# Patient Record
Sex: Male | Born: 1955 | Race: White | Hispanic: No | Marital: Married | Smoking: Former smoker
Health system: Southern US, Community
[De-identification: ages and names within clinical notes are randomized; demographics above are authoritative.]

## PROBLEM LIST (undated history)

## (undated) DIAGNOSIS — H9319 Tinnitus, unspecified ear: Secondary | ICD-10-CM

## (undated) DIAGNOSIS — K219 Gastro-esophageal reflux disease without esophagitis: Secondary | ICD-10-CM

## (undated) DIAGNOSIS — N400 Enlarged prostate without lower urinary tract symptoms: Secondary | ICD-10-CM

## (undated) DIAGNOSIS — E785 Hyperlipidemia, unspecified: Secondary | ICD-10-CM

## (undated) DIAGNOSIS — G629 Polyneuropathy, unspecified: Secondary | ICD-10-CM

## (undated) DIAGNOSIS — I10 Essential (primary) hypertension: Secondary | ICD-10-CM

## (undated) DIAGNOSIS — C61 Malignant neoplasm of prostate: Secondary | ICD-10-CM

## (undated) DIAGNOSIS — T8859XA Other complications of anesthesia, initial encounter: Secondary | ICD-10-CM

## (undated) DIAGNOSIS — T4145XA Adverse effect of unspecified anesthetic, initial encounter: Secondary | ICD-10-CM

## (undated) HISTORY — DX: Malignant neoplasm of prostate: C61

## (undated) HISTORY — PX: FACIAL FRACTURE SURGERY: SHX1570

---
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
## 2007-05-29 ENCOUNTER — Ambulatory Visit: Payer: Self-pay | Admitting: Internal Medicine

## 2016-03-22 DIAGNOSIS — K219 Gastro-esophageal reflux disease without esophagitis: Secondary | ICD-10-CM | POA: Insufficient documentation

## 2016-03-22 DIAGNOSIS — E782 Mixed hyperlipidemia: Secondary | ICD-10-CM | POA: Insufficient documentation

## 2019-08-15 ENCOUNTER — Emergency Department (HOSPITAL_COMMUNITY)

## 2019-08-15 ENCOUNTER — Other Ambulatory Visit: Payer: Self-pay

## 2019-08-15 ENCOUNTER — Encounter (HOSPITAL_COMMUNITY): Payer: Self-pay

## 2019-08-15 ENCOUNTER — Emergency Department (HOSPITAL_COMMUNITY)
Admission: EM | Admit: 2019-08-15 | Discharge: 2019-08-15 | Disposition: A | Discharge status: Home / Self Care | Attending: Emergency Medicine | Admitting: Emergency Medicine

## 2019-08-15 DIAGNOSIS — H9313 Tinnitus, bilateral: Secondary | ICD-10-CM | POA: Diagnosis not present

## 2019-08-15 DIAGNOSIS — R0602 Shortness of breath: Secondary | ICD-10-CM | POA: Diagnosis not present

## 2019-08-15 DIAGNOSIS — R42 Dizziness and giddiness: Secondary | ICD-10-CM | POA: Diagnosis present

## 2019-08-15 DIAGNOSIS — Z79899 Other long term (current) drug therapy: Secondary | ICD-10-CM | POA: Insufficient documentation

## 2019-08-15 DIAGNOSIS — I10 Essential (primary) hypertension: Secondary | ICD-10-CM | POA: Insufficient documentation

## 2019-08-15 DIAGNOSIS — R0600 Dyspnea, unspecified: Secondary | ICD-10-CM

## 2019-08-15 LAB — CBC
HCT: 44.2 % (ref 39.0–52.0)
Hemoglobin: 14.4 g/dL (ref 13.0–17.0)
MCH: 28.5 pg (ref 26.0–34.0)
MCHC: 32.6 g/dL (ref 30.0–36.0)
MCV: 87.4 fL (ref 80.0–100.0)
Platelets: 170 10*3/uL (ref 150–400)
RBC: 5.06 MIL/uL (ref 4.22–5.81)
RDW: 13.2 % (ref 11.5–15.5)
WBC: 8.6 10*3/uL (ref 4.0–10.5)
nRBC: 0 % (ref 0.0–0.2)

## 2019-08-15 LAB — BASIC METABOLIC PANEL
Anion gap: 11 (ref 5–15)
BUN: 25 mg/dL — ABNORMAL HIGH (ref 8–23)
CO2: 21 mmol/L — ABNORMAL LOW (ref 22–32)
Calcium: 9.6 mg/dL (ref 8.9–10.3)
Chloride: 106 mmol/L (ref 98–111)
Creatinine, Ser: 1.55 mg/dL — ABNORMAL HIGH (ref 0.61–1.24)
GFR calc Af Amer: 55 mL/min — ABNORMAL LOW (ref >60)
GFR calc non Af Amer: 47 mL/min — ABNORMAL LOW (ref >60)
Glucose, Bld: 111 mg/dL — ABNORMAL HIGH (ref 70–99)
Potassium: 4.2 mmol/L (ref 3.5–5.1)
Sodium: 138 mmol/L (ref 135–145)

## 2019-08-15 LAB — URINALYSIS, ROUTINE W REFLEX MICROSCOPIC
Bilirubin Urine: NEGATIVE
Glucose, UA: NEGATIVE mg/dL
Hgb urine dipstick: NEGATIVE
Ketones, ur: NEGATIVE mg/dL
Leukocytes,Ua: NEGATIVE
Nitrite: NEGATIVE
Protein, ur: NEGATIVE mg/dL
Specific Gravity, Urine: 1.02 (ref 1.005–1.030)
pH: 5 (ref 5.0–8.0)

## 2019-08-15 LAB — HEPATIC FUNCTION PANEL
ALT: 22 U/L (ref 0–44)
AST: 24 U/L (ref 15–41)
Albumin: 4.1 g/dL (ref 3.5–5.0)
Alkaline Phosphatase: 60 U/L (ref 38–126)
Bilirubin, Direct: 0.1 mg/dL (ref 0.0–0.2)
Indirect Bilirubin: 1 mg/dL — ABNORMAL HIGH (ref 0.3–0.9)
Total Bilirubin: 1.1 mg/dL (ref 0.3–1.2)
Total Protein: 7.1 g/dL (ref 6.5–8.1)

## 2019-08-15 LAB — BRAIN NATRIURETIC PEPTIDE: B Natriuretic Peptide: 13.3 pg/mL (ref 0.0–100.0)

## 2019-08-15 LAB — D-DIMER, QUANTITATIVE: D-Dimer, Quant: 0.37 ug/mL-FEU (ref 0.00–0.50)

## 2019-08-15 NOTE — ED Provider Notes (Addendum)
[facility] Provider Note   CSN: [id number] Arrival date & time: 08/15/19  1250     History Chief Complaint  Patient presents with  . Dizziness  . Shortness of Breath    Kelley Wolken is a 64 y.o. male.  64 year old male with past medical history including hypertension, hyperlipidemia who presents with multiple complaints including tinnitus, dizziness, and shortness of breath.  Patient reports that he has had 2 days of bilateral intermittent tinnitus without any associated ear pain.  He reports some mild nasal congestion but no other URI symptoms.  No fevers.  He reports that he has had dizziness for the past 2 days which she states is a lightheadedness feeling that occurs with standing.  No syncope.  No associated chest pain.  He reports a 1 month history of dyspnea on exertion.  No associated leg swelling.  He is compliant with his medications.  Of note, he states that he was told he has an elevated PSA level but has not seen a urologist yet for prostate biopsy.  The history is provided by the patient.  Dizziness Associated symptoms: shortness of breath   Shortness of Breath      History reviewed. No pertinent past medical history.  There are no problems to display for this patient.   History reviewed. No pertinent surgical history.    PMH: HTN HLD Enlarged prostate  No family history on file.  Social History   Tobacco Use  . Smoking status: Not on file  Substance Use Topics  . Alcohol use: Not on file  . Drug use: Not on file  denies drug or alcohol use  Home Medications Prior to Admission medications   Not on File  HCTZ Lisinopril Flomax Duloxetine  Allergies    Patient has no allergy information on record.  Review of Systems   Review of Systems  Respiratory: Positive for shortness of breath.   Neurological: Positive for dizziness.   All other systems reviewed and are negative except that which was mentioned in  HPI  Physical Exam Updated Vital Signs BP 122/82 (BP Location: Right Arm)   Pulse 80   Temp 99.1 F (37.3 C) (Oral)   Resp 18   SpO2 99%   Physical Exam Vitals and nursing note reviewed.  Constitutional:      General: He is not in acute distress.    Appearance: He is well-developed.     Comments: Awake, alert  HENT:     Head: Normocephalic and atraumatic.     Right Ear: Tympanic membrane and ear canal normal.     Left Ear: Tympanic membrane and ear canal normal.     Mouth/Throat:     Mouth: Mucous membranes are moist.     Pharynx: Oropharynx is clear.  Eyes:     Extraocular Movements: Extraocular movements intact.     Conjunctiva/sclera: Conjunctivae normal.     Pupils: Pupils are equal, round, and reactive to light.  Cardiovascular:     Rate and Rhythm: Normal rate and regular rhythm.     Heart sounds: Normal heart sounds. No murmur.  Pulmonary:     Effort: Pulmonary effort is normal. No respiratory distress.     Breath sounds: Normal breath sounds.  Abdominal:     General: Bowel sounds are normal. There is no distension.     Palpations: Abdomen is soft.     Tenderness: There is no abdominal tenderness.  Musculoskeletal:     Cervical back: Neck supple.  Right lower leg: No edema.     Left lower leg: No edema.  Skin:    General: Skin is warm and dry.  Neurological:     Mental Status: He is alert and oriented to person, place, and time.     Cranial Nerves: No cranial nerve deficit.     Motor: No abnormal muscle tone.     Deep Tendon Reflexes: Reflexes are normal and symmetric.     Comments: Fluent speech  5/5 strength and normal sensation x all 4 extremities  Psychiatric:        Thought Content: Thought content normal.        Judgment: Judgment normal.     ED Results / Procedures / Treatments   Labs (all labs ordered are listed, but only abnormal results are displayed) Labs Reviewed  BASIC METABOLIC PANEL - Abnormal; Notable for the following components:        Result Value   CO2 21 (*)    Glucose, Bld 111 (*)    BUN 25 (*)    Creatinine, Ser 1.55 (*)    GFR calc non Af Amer 47 (*)    GFR calc Af Amer 55 (*)    All other components within normal limits  HEPATIC FUNCTION PANEL - Abnormal; Notable for the following components:   Indirect Bilirubin 1.0 (*)    All other components within normal limits  CBC  D-DIMER, QUANTITATIVE (NOT AT [facility])  BRAIN NATRIURETIC PEPTIDE  URINALYSIS, ROUTINE W REFLEX MICROSCOPIC    EKG EKG Interpretation  Date/Time:  Thursday August 15 2019 17:24:51 EST Ventricular Rate:  72 PR Interval:  170 QRS Duration: 86 QT Interval:  396 QTC Calculation: 433 R Axis:   20 Text Interpretation: Normal sinus rhythm Septal infarct , age undetermined Abnormal ECG No previous ECGs available Confirmed by Theotis Burrow [phone]) on 08/15/2019 6:25:48 PM   Radiology DG Chest 2 View  Result Date: 08/15/2019 CLINICAL DATA:  Shortness of breath EXAM: CHEST - 2 VIEW COMPARISON:  None. FINDINGS: The heart size and mediastinal contours are within normal limits. Both lungs are clear. Disc degenerative disease of the thoracic spine. IMPRESSION: No acute abnormality of the lungs. Electronically Signed   By: Eddie Candle M.D.   On: 08/15/2019 14:01   CT Head Wo Contrast  Result Date: 08/15/2019 CLINICAL DATA:  Tinnitus ringing in the ear EXAM: CT HEAD WITHOUT CONTRAST TECHNIQUE: Contiguous axial images were obtained from the base of the skull through the vertex without intravenous contrast. COMPARISON:  None. FINDINGS: Brain: No evidence of acute territorial infarction, hemorrhage, hydrocephalus,extra-axial collection or mass lesion/mass effect. Normal gray-white differentiation. Ventricles are normal in size and contour. Vascular: No hyperdense vessel or unexpected calcification. Skull: The skull is intact. No fracture or focal lesion identified. There is been a prior non healed fracture of the right nasal bone with rightward  deviation. Sinuses/Orbits: The visualized paranasal sinuses and mastoid air cells are clear. The orbits and globes intact. Other: None IMPRESSION: No acute intracranial abnormality. Electronically Signed   By: Prudencio Pair M.D.   On: 08/15/2019 18:55    Procedures Procedures (including critical care time)  Medications Ordered in ED Medications - No data to display  ED Course  I have reviewed the triage vital signs and the nursing notes.  Pertinent labs & imaging results that were available during my care of the patient were reviewed by me and considered in my medical decision making (see chart for details).  Orthostatic VS for the  past 24 hrs:  BP- Lying Pulse- Lying BP- Sitting Pulse- Sitting BP- Standing at 0 minutes Pulse- Standing at 0 minutes  08/15/19 1604 106/69 74 105/81 88 107/74 89       MDM Rules/Calculators/A&P                      Well appearing on exam, no neuro deficits, no ear findings. Orthostatics negative.  1) Tinnitus: no abnormal findings on physical exam. No vertigo symptoms. Takes aspirin once daily, no heavy ASA use. Head CT negative. Discussed f/u with ENT for further eval.  2) SOB: D-dimer negative making PE unlikely. No wheezing on exam, no evidence of volume overload, CXR clear. No chest pain or other symptoms suggestive of ACS. BNP normal and EKG unremarkable. Recommended close PCP f/u.  3) dizziness: no description of vertigo, described as lighthteadedness. Orthostatics negative. Reassuring CMP, CBC without evidence of infection or dehydration. No obvious culprit in medication list. Recommended PCP f/u for further eval.  4) Prostate problems: UA normal here. No acute sx, just states he has had rising PSA. PRovided w/ [facility] f/u info.  I have extensively reviewed return precautions including any chest pain, strokelike symptoms, or sudden worsening of any of his symptoms.  He voiced understanding. Final Clinical Impression(s) / ED  Diagnoses Final diagnoses:  Tinnitus of both ears  Dizziness  Dyspnea, unspecified type    Rx / DC Orders ED Discharge Orders    None       Tiyonna Sardinha, Wenda Overland, MD 08/15/19 1941    Rex Kras, Wenda Overland, MD 08/15/19 1956

## 2019-08-15 NOTE — ED Notes (Signed)
Pt transported to CT ?

## 2019-08-15 NOTE — ED Triage Notes (Signed)
Pt bib ems for dizziness for 2 days and ringing in his ears for 4 days with SOB on exertion. Pt denies chest pain n/v. VSS Pt a.o   18G RAC

## 2019-09-16 ENCOUNTER — Inpatient Hospital Stay (HOSPITAL_COMMUNITY)
Admission: EM | Admit: 2019-09-16 | Discharge: 2019-09-20 | DRG: 683 | Disposition: A | Discharge status: Home / Self Care | Attending: Internal Medicine | Admitting: Internal Medicine

## 2019-09-16 ENCOUNTER — Other Ambulatory Visit: Payer: Self-pay

## 2019-09-16 ENCOUNTER — Encounter (HOSPITAL_COMMUNITY): Payer: Self-pay | Admitting: Emergency Medicine

## 2019-09-16 DIAGNOSIS — F329 Major depressive disorder, single episode, unspecified: Secondary | ICD-10-CM | POA: Diagnosis present

## 2019-09-16 DIAGNOSIS — E785 Hyperlipidemia, unspecified: Secondary | ICD-10-CM | POA: Diagnosis present

## 2019-09-16 DIAGNOSIS — N179 Acute kidney failure, unspecified: Secondary | ICD-10-CM | POA: Diagnosis not present

## 2019-09-16 DIAGNOSIS — I5032 Chronic diastolic (congestive) heart failure: Secondary | ICD-10-CM | POA: Diagnosis present

## 2019-09-16 DIAGNOSIS — R0602 Shortness of breath: Secondary | ICD-10-CM

## 2019-09-16 DIAGNOSIS — H9313 Tinnitus, bilateral: Secondary | ICD-10-CM

## 2019-09-16 DIAGNOSIS — I10 Essential (primary) hypertension: Secondary | ICD-10-CM | POA: Diagnosis present

## 2019-09-16 DIAGNOSIS — J0100 Acute maxillary sinusitis, unspecified: Secondary | ICD-10-CM

## 2019-09-16 DIAGNOSIS — Z20822 Contact with and (suspected) exposure to covid-19: Secondary | ICD-10-CM | POA: Diagnosis present

## 2019-09-16 DIAGNOSIS — Z820 Family history of epilepsy and other diseases of the nervous system: Secondary | ICD-10-CM

## 2019-09-16 DIAGNOSIS — N400 Enlarged prostate without lower urinary tract symptoms: Secondary | ICD-10-CM | POA: Diagnosis present

## 2019-09-16 DIAGNOSIS — R0600 Dyspnea, unspecified: Secondary | ICD-10-CM

## 2019-09-16 DIAGNOSIS — G629 Polyneuropathy, unspecified: Secondary | ICD-10-CM

## 2019-09-16 DIAGNOSIS — R972 Elevated prostate specific antigen [PSA]: Secondary | ICD-10-CM | POA: Diagnosis present

## 2019-09-16 DIAGNOSIS — Z87891 Personal history of nicotine dependence: Secondary | ICD-10-CM

## 2019-09-16 DIAGNOSIS — N1831 Chronic kidney disease, stage 3a: Secondary | ICD-10-CM | POA: Diagnosis present

## 2019-09-16 DIAGNOSIS — Z8249 Family history of ischemic heart disease and other diseases of the circulatory system: Secondary | ICD-10-CM

## 2019-09-16 DIAGNOSIS — H8109 Meniere's disease, unspecified ear: Secondary | ICD-10-CM | POA: Diagnosis present

## 2019-09-16 DIAGNOSIS — Z23 Encounter for immunization: Secondary | ICD-10-CM

## 2019-09-16 DIAGNOSIS — I130 Hypertensive heart and chronic kidney disease with heart failure and stage 1 through stage 4 chronic kidney disease, or unspecified chronic kidney disease: Secondary | ICD-10-CM | POA: Diagnosis present

## 2019-09-16 DIAGNOSIS — R42 Dizziness and giddiness: Secondary | ICD-10-CM

## 2019-09-16 HISTORY — DX: Hyperlipidemia, unspecified: E78.5

## 2019-09-16 HISTORY — DX: Polyneuropathy, unspecified: G62.9

## 2019-09-16 HISTORY — DX: Benign prostatic hyperplasia without lower urinary tract symptoms: N40.0

## 2019-09-16 HISTORY — DX: Tinnitus, unspecified ear: H93.19

## 2019-09-16 HISTORY — DX: Gastro-esophageal reflux disease without esophagitis: K21.9

## 2019-09-16 HISTORY — DX: Other complications of anesthesia, initial encounter: T88.59XA

## 2019-09-16 HISTORY — DX: Essential (primary) hypertension: I10

## 2019-09-16 LAB — CBC WITH DIFFERENTIAL/PLATELET
Abs Immature Granulocytes: 0.02 10*3/uL (ref 0.00–0.07)
Basophils Absolute: 0 10*3/uL (ref 0.0–0.1)
Basophils Relative: 1 %
Eosinophils Absolute: 0.3 10*3/uL (ref 0.0–0.5)
Eosinophils Relative: 4 %
HCT: 43.2 % (ref 39.0–52.0)
Hemoglobin: 14.5 g/dL (ref 13.0–17.0)
Immature Granulocytes: 0 %
Lymphocytes Relative: 18 %
Lymphs Abs: 1.3 10*3/uL (ref 0.7–4.0)
MCH: 28.7 pg (ref 26.0–34.0)
MCHC: 33.6 g/dL (ref 30.0–36.0)
MCV: 85.4 fL (ref 80.0–100.0)
Monocytes Absolute: 0.6 10*3/uL (ref 0.1–1.0)
Monocytes Relative: 8 %
Neutro Abs: 5 10*3/uL (ref 1.7–7.7)
Neutrophils Relative %: 69 %
Platelets: 163 10*3/uL (ref 150–400)
RBC: 5.06 MIL/uL (ref 4.22–5.81)
RDW: 13.1 % (ref 11.5–15.5)
WBC: 7.1 10*3/uL (ref 4.0–10.5)
nRBC: 0 % (ref 0.0–0.2)

## 2019-09-16 LAB — COMPREHENSIVE METABOLIC PANEL
ALT: 25 U/L (ref 0–44)
AST: 21 U/L (ref 15–41)
Albumin: 3.8 g/dL (ref 3.5–5.0)
Alkaline Phosphatase: 59 U/L (ref 38–126)
Anion gap: 10 (ref 5–15)
BUN: 31 mg/dL — ABNORMAL HIGH (ref 8–23)
CO2: 20 mmol/L — ABNORMAL LOW (ref 22–32)
Calcium: 9.3 mg/dL (ref 8.9–10.3)
Chloride: 109 mmol/L (ref 98–111)
Creatinine, Ser: 1.95 mg/dL — ABNORMAL HIGH (ref 0.61–1.24)
GFR calc Af Amer: 42 mL/min — ABNORMAL LOW (ref >60)
GFR calc non Af Amer: 36 mL/min — ABNORMAL LOW (ref >60)
Glucose, Bld: 195 mg/dL — ABNORMAL HIGH (ref 70–99)
Potassium: 4.5 mmol/L (ref 3.5–5.1)
Sodium: 139 mmol/L (ref 135–145)
Total Bilirubin: 0.4 mg/dL (ref 0.3–1.2)
Total Protein: 6.6 g/dL (ref 6.5–8.1)

## 2019-09-16 LAB — POC SARS CORONAVIRUS 2 AG -  ED: SARS Coronavirus 2 Ag: NEGATIVE

## 2019-09-16 NOTE — ED Triage Notes (Signed)
Per EMS, pt from [place], C/O SOB x1 month however "really bad today".  Was seen earlier in week but was not able to get get steroids or antibiotic.  Lungs are clear, no fever, no pain.  RR 26,   18 G L AC 133/86 106HR

## 2019-09-17 ENCOUNTER — Emergency Department (HOSPITAL_COMMUNITY)

## 2019-09-17 ENCOUNTER — Encounter (HOSPITAL_COMMUNITY): Payer: Self-pay | Admitting: Internal Medicine

## 2019-09-17 ENCOUNTER — Inpatient Hospital Stay (HOSPITAL_COMMUNITY)

## 2019-09-17 DIAGNOSIS — R0602 Shortness of breath: Secondary | ICD-10-CM

## 2019-09-17 DIAGNOSIS — N179 Acute kidney failure, unspecified: Secondary | ICD-10-CM | POA: Diagnosis present

## 2019-09-17 DIAGNOSIS — E785 Hyperlipidemia, unspecified: Secondary | ICD-10-CM | POA: Diagnosis present

## 2019-09-17 DIAGNOSIS — N400 Enlarged prostate without lower urinary tract symptoms: Secondary | ICD-10-CM | POA: Diagnosis present

## 2019-09-17 DIAGNOSIS — I10 Essential (primary) hypertension: Secondary | ICD-10-CM | POA: Diagnosis present

## 2019-09-17 DIAGNOSIS — G629 Polyneuropathy, unspecified: Secondary | ICD-10-CM

## 2019-09-17 DIAGNOSIS — I5032 Chronic diastolic (congestive) heart failure: Secondary | ICD-10-CM | POA: Diagnosis present

## 2019-09-17 LAB — LIPID PANEL
Cholesterol: 150 mg/dL (ref 0–200)
HDL: 29 mg/dL — ABNORMAL LOW (ref >40)
LDL Cholesterol: 86 mg/dL (ref 0–99)
Total CHOL/HDL Ratio: 5.2 RATIO
Triglycerides: 176 mg/dL — ABNORMAL HIGH (ref <150)
VLDL: 35 mg/dL (ref 0–40)

## 2019-09-17 LAB — HIV ANTIBODY (ROUTINE TESTING W REFLEX): HIV Screen 4th Generation wRfx: NONREACTIVE

## 2019-09-17 LAB — TROPONIN I (HIGH SENSITIVITY): Troponin I (High Sensitivity): 3 ng/L (ref <18)

## 2019-09-17 LAB — SARS CORONAVIRUS 2 (TAT 6-24 HRS): SARS Coronavirus 2: NEGATIVE

## 2019-09-17 LAB — HEMOGLOBIN A1C
Hgb A1c MFr Bld: 6.1 % — ABNORMAL HIGH (ref 4.8–5.6)
Mean Plasma Glucose: 128.37 mg/dL

## 2019-09-17 LAB — TSH: TSH: 2.456 u[IU]/mL (ref 0.350–4.500)

## 2019-09-17 LAB — ECHOCARDIOGRAM COMPLETE

## 2019-09-17 MED ORDER — ONDANSETRON HCL 4 MG/2ML IJ SOLN: 4.0000 mg | Freq: Four times a day (QID) | INTRAMUSCULAR | Status: DC | PRN

## 2019-09-17 MED ORDER — ONDANSETRON HCL 4 MG PO TABS: 4.0000 mg | ORAL_TABLET | Freq: Four times a day (QID) | ORAL | Status: DC | PRN

## 2019-09-17 MED ORDER — MECLIZINE HCL 25 MG PO TABS
25.0000 mg | ORAL_TABLET | Freq: Once | ORAL | Status: AC
  Administered 2019-09-17: 25 mg via ORAL
  Filled 2019-09-17: qty 1

## 2019-09-17 MED ORDER — ACETAMINOPHEN 325 MG PO TABS: 650.0000 mg | ORAL_TABLET | Freq: Four times a day (QID) | ORAL | Status: DC | PRN

## 2019-09-17 MED ORDER — ACETAMINOPHEN 500 MG PO TABS
1000.0000 mg | ORAL_TABLET | Freq: Once | ORAL | Status: AC
  Administered 2019-09-17: 1000 mg via ORAL
  Filled 2019-09-17: qty 2

## 2019-09-17 MED ORDER — DOCUSATE SODIUM 100 MG PO CAPS
100.0000 mg | ORAL_CAPSULE | Freq: Two times a day (BID) | ORAL | Status: DC
  Administered 2019-09-19: 100 mg via ORAL
  Filled 2019-09-17 (×7): qty 1

## 2019-09-17 MED ORDER — SODIUM CHLORIDE 0.9 % IV BOLUS (SEPSIS)
1000.0000 mL | Freq: Once | INTRAVENOUS | Status: AC
  Administered 2019-09-17: 1000 mL via INTRAVENOUS

## 2019-09-17 MED ORDER — TAMSULOSIN HCL 0.4 MG PO CAPS
0.4000 mg | ORAL_CAPSULE | Freq: Every day | ORAL | Status: DC
  Administered 2019-09-17 – 2019-09-20 (×4): 0.4 mg via ORAL
  Filled 2019-09-17 (×4): qty 1

## 2019-09-17 MED ORDER — SODIUM CHLORIDE 0.9% FLUSH
3.0000 mL | Freq: Two times a day (BID) | INTRAVENOUS | Status: DC
  Administered 2019-09-19 – 2019-09-20 (×2): 3 mL via INTRAVENOUS

## 2019-09-17 MED ORDER — DULOXETINE HCL 20 MG PO CPEP
40.0000 mg | ORAL_CAPSULE | Freq: Once | ORAL | Status: AC
  Administered 2019-09-17: 40 mg via ORAL
  Filled 2019-09-17: qty 2

## 2019-09-17 MED ORDER — SODIUM CHLORIDE 0.9 % IV SOLN: INTRAVENOUS | Status: DC

## 2019-09-17 MED ORDER — ATORVASTATIN CALCIUM 10 MG PO TABS
10.0000 mg | ORAL_TABLET | Freq: Every day | ORAL | Status: DC
  Administered 2019-09-17 – 2019-09-20 (×4): 10 mg via ORAL
  Filled 2019-09-17 (×4): qty 1

## 2019-09-17 MED ORDER — ACETAMINOPHEN 650 MG RE SUPP: 650.0000 mg | Freq: Four times a day (QID) | RECTAL | Status: DC | PRN

## 2019-09-17 MED ORDER — DULOXETINE HCL 30 MG PO CPEP
30.0000 mg | ORAL_CAPSULE | Freq: Two times a day (BID) | ORAL | Status: DC
  Administered 2019-09-17 – 2019-09-20 (×7): 30 mg via ORAL
  Filled 2019-09-17 (×8): qty 1

## 2019-09-17 MED ORDER — ENOXAPARIN SODIUM 40 MG/0.4ML ~~LOC~~ SOLN
40.0000 mg | SUBCUTANEOUS | Status: DC
  Administered 2019-09-18 – 2019-09-19 (×2): 40 mg via SUBCUTANEOUS
  Filled 2019-09-17 (×2): qty 0.4

## 2019-09-17 MED ORDER — ASPIRIN EC 81 MG PO TBEC
81.0000 mg | DELAYED_RELEASE_TABLET | Freq: Every day | ORAL | Status: DC
  Administered 2019-09-17 – 2019-09-20 (×4): 81 mg via ORAL
  Filled 2019-09-17 (×4): qty 1

## 2019-09-17 NOTE — ED Provider Notes (Signed)
TIME SEEN: 2:49 AM  CHIEF COMPLAINT: Multiple complaints  HPI: Patient is a 64 year old male who presents to the emergency department multiple complaints.  He reports for the past month he has had dizziness that is like a lightheadedness and the room spinning.  He has had tinnitus without hearing loss.  He reports bilateral ear pain and frequent nosebleeds.  He is also felt short of breath but no chest pain or chest discomfort.  Shortness of breath worse with exertion.  No vomiting or diarrhea.  No fever.  Recently got out of prison and is living in a halfway house.  No known history of chronic kidney disease.  States he does have an elevated PSA but has not had a biopsy of his prostate.  Experiencing restless leg syndrome currently requesting a dose of his Cymbalta.  Was seen by an outpatient doctor and prescribed Augmentin and prednisone to help with his symptoms of sinusitis but has not been able to get them filled because they have to be approved by an outside entity in [place].  ROS: See HPI Constitutional: no fever  Eyes: no drainage  ENT: no runny nose   Cardiovascular:  no chest pain  Resp:  SOB  GI: no vomiting GU: no dysuria Integumentary: no rash  Allergy: no hives  Musculoskeletal: no leg swelling  Neurological: no slurred speech ROS otherwise negative  PAST MEDICAL HISTORY/PAST SURGICAL HISTORY:  History reviewed. No pertinent past medical history.  MEDICATIONS:  Prior to Admission medications   Not on File    ALLERGIES:  No Known Allergies  SOCIAL HISTORY:  Social History   Tobacco Use  . Smoking status: Not on file  Substance Use Topics  . Alcohol use: Not on file    FAMILY HISTORY: No family history on file.  EXAM: BP 119/83 (BP Location: Right Arm)   Pulse 77   Temp 97.7 F (36.5 C) (Oral)   Resp 20   SpO2 100%  CONSTITUTIONAL: Alert and oriented and responds appropriately to questions. Well-appearing; well-nourished HEAD:  Normocephalic EYES: Conjunctivae clear, pupils appear equal, EOM appear intact ENT: normal nose; moist mucous membranes; TMs are clear bilaterally without erythema, purulence, bulging, perforation, effusion.  No cerumen impaction or sign of foreign body in the external auditory canal. No inflammation, erythema or drainage from the external auditory canal. No signs of mastoiditis. No pain with manipulation of the pinna bilaterally.  No active epistaxis.  Pain over the maxillary sinuses without facial swelling. NECK: Supple, normal ROM CARD: RRR; S1 and S2 appreciated; no murmurs, no clicks, no rubs, no gallops RESP: Normal chest excursion without splinting or tachypnea; breath sounds clear and equal bilaterally; no wheezes, no rhonchi, no rales, no hypoxia or respiratory distress, speaking full sentences ABD/GI: Normal bowel sounds; non-distended; soft, non-tender, no rebound, no guarding, no peritoneal signs, no hepatosplenomegaly BACK:  The back appears normal EXT: Normal ROM in all joints; no deformity noted, no edema; no cyanosis SKIN: Normal color for age and race; warm; no rash on exposed skin NEURO: Moves all extremities equally, restless leg on exam, normal speech, no facial asymmetry PSYCH: The patient's mood and manner are appropriate.   MEDICAL DECISION MAKING: Patient here with complaints mostly of shortness of breath.  He is concerned he could have Covid but symptoms been ongoing for over a month.  He would be outside of infectious window.  His chest x-ray is clear and shows no pneumonia, edema or pneumothorax.  Satting 97% on room air at rest  without signs of respiratory distress or increased work of breathing.  Lungs are currently clear.  He is concerned this could be a "blockage".  We will add on troponin.  His EKG shows no ischemic change.  He did have a negative D-dimer on February 4.  As for his tinnitus, nosebleeds, sinus pain for over a month, I agree with outpatient Augmentin and  prednisone.  He states he is having a hard time getting these medications filled.    He is requesting also a dose of his Cymbalta for his chronic neuropathy and restless leg.  His labs today do show acute kidney injury.  Kidney function was elevated to 1.55 a month ago and is now 1.95.  He denies any history of chronic kidney disease.  Given this is worsening over the past month, will give IV fluids and admit for observation.  ED PROGRESS: 4:18 AM Discussed patient's case with hospitalist, Dr. Marlowe Sax.  I have recommended admission and patient (and family if present) agree with this plan. Admitting physician will place admission orders.   I reviewed all nursing notes, vitals, pertinent previous records and interpreted all EKGs, lab and urine results, imaging (as available).     EKG Interpretation  Date/Time:  Monday September 16 2019 20:06:57 EST Ventricular Rate:  107 PR Interval:  172 QRS Duration: 80 QT Interval:  304 QTC Calculation: 405 R Axis:   11 Text Interpretation: Sinus tachycardia Anteroseptal infarct , age undetermined Abnormal ECG When compared with ECG of 08/15/2019, HEART RATE has increased Confirmed by Delora Fuel ([id number]) on 09/17/2019 12:27:32 AM         Adrian Herrera was evaluated in Emergency Department on 09/17/2019 for the symptoms described in the history of present illness. He was evaluated in the context of the global COVID-19 pandemic, which necessitated consideration that the patient might be at risk for infection with the SARS-CoV-2 virus that causes COVID-19. Institutional protocols and algorithms that pertain to the evaluation of patients at risk for COVID-19 are in a state of rapid change based on information released by regulatory bodies including the CDC and federal and state organizations. These policies and algorithms were followed during the patient's care in the ED.  Patient was seen wearing N95, face shield, gloves.    Adrian Herrera, Delice Bison, DO 09/17/19 [phone]

## 2019-09-17 NOTE — Plan of Care (Signed)
  Problem: Education: Goal: Knowledge of General Education information will improve Description Including pain rating scale, medication(s)/side effects and non-pharmacologic comfort measures Outcome: Progressing   

## 2019-09-17 NOTE — ED Notes (Signed)
Pt transported to ultrasound.

## 2019-09-17 NOTE — Progress Notes (Signed)
  Echocardiogram 2D Echocardiogram has been performed.  Adrian Herrera 09/17/2019, 2:44 PM

## 2019-09-17 NOTE — ED Notes (Signed)
covid swab collected, labeled with 2 pt identifiers, and brought to lab 

## 2019-09-17 NOTE — ED Notes (Signed)
Admitting message to inquire about pt diet order.

## 2019-09-17 NOTE — ED Notes (Signed)
All meds given per MAR. Name/DOB verified with pt 

## 2019-09-17 NOTE — ED Notes (Signed)
Lunch Tray Ordered @ 1038.  

## 2019-09-17 NOTE — H&P (Signed)
History and Physical    Adrian Herrera ZD:[id number] DOB: July 26, 1955 DOA: 09/16/2019  PCP: System, [place] Not In - Ginny Forth, [place] Consultants:  Gerlene Burdock - urology, [place] Patient coming from:  Home - lives alone; NOK: Earna Coder, girlfriend, [phone]  Chief Complaint: Dizziness  HPI: Adrian Herrera is a 64 y.o. male with medical history significant of HTN and HLD presenting with dizziness.  He was seen on 08/15/19 by Dr. Rex Kras in the ER with dizziness and SOB and was referred to ENT and urology.   He reports that he is easily out of breath, difficulty walking far.  He developed tinnitus about 1/28.  Dizzy with standing up or bending over.  He came to the ER and was diagnosed with tinnitus.  +orthopnea.  No PND but wakes up at night with phlegm in his throat.  He also has bloody drainage when he blows his nose.  Mild LE edema.  He feels like he can't catch his breath because of nasal congestion.  No fever.  He also has a throbbing pain intermittently in his left toe, has h/o neuropathy.  No difficulty with eating and drinking.  His PSA was 9, 10, 10.1, hard to pee then, given Flomax with improvement.    ED Course:  Carryover, per Dr. Marlowe Sax:  Patient presented with multiple complaints including shortness of breath and dizziness. He was seen a month ago for similar complaints. Creatinine was 1.55 at that time and now up to 1.95. No documented history of CKD. No complains of vomiting or diarrhea. Not on any nephrotoxic medications. Admission requested for IV fluid hydration and further monitoring of renal function. He does not have good outpatient follow-up, recently came out of prison. As far as shortness of breath is concerned, chest x-ray negative and was ambulated in the ED and maintained oxygen saturation above 96%. He had negative D-dimer during previous ED visit a few days ago so it was not repeated. Rapid Covid test negative. Troponin pending.  Review of Systems: As per HPI; otherwise  review of systems reviewed and negative.   Ambulatory Status:  Ambulates without assistance  Past Medical History:  Diagnosis Date  . BPH with elevated PSA   . Dyslipidemia   . Hypertension   . Neuropathy     Past Surgical History:  Procedure Laterality Date  . FACIAL FRACTURE SURGERY     "I run into a crow bar about 4 times"    Social History   Socioeconomic History  . Marital status: Divorced    Spouse name: Not on file  . Number of children: Not on file  . Years of education: Not on file  . Highest education level: Not on file  Occupational History  . Occupation: retiring from Teacher, adult education care  Tobacco Use  . Smoking status: Former Smoker    Packs/day: 2.00    Years: 15.00    Pack years: 30.00  . Smokeless tobacco: Never Used  Substance and Sexual Activity  . Alcohol use: Not Currently  . Drug use: Not Currently    Types: Marijuana  . Sexual activity: Not on file  Other Topics Concern  . Not on file  Social History Narrative  . Not on file   Social Determinants of Health   Financial Resource Strain:   . Difficulty of Paying Living Expenses: Not on file  Food Insecurity:   . Worried About Charity fundraiser in the Last Year: Not on file  . Ran Out of Food in the Last  Year: Not on file  Transportation Needs:   . Lack of Transportation (Medical): Not on file  . Lack of Transportation (Non-Medical): Not on file  Physical Activity:   . Days of Exercise per Week: Not on file  . Minutes of Exercise per Session: Not on file  Stress:   . Feeling of Stress : Not on file  Social Connections:   . Frequency of Communication with Friends and Family: Not on file  . Frequency of Social Gatherings with Friends and Family: Not on file  . Attends Religious Services: Not on file  . Active Member of Clubs or Organizations: Not on file  . Attends Archivist Meetings: Not on file  . Marital Status: Not on file  Intimate Partner Violence:   . Fear of Current or  Ex-Partner: Not on file  . Emotionally Abused: Not on file  . Physically Abused: Not on file  . Sexually Abused: Not on file    No Known Allergies  Family History  Problem Relation Age of Onset  . CAD Mother   . Parkinson's disease Father   . Dementia Father     Prior to Admission medications   Not on File    Physical Exam: Vitals:   09/17/19 1230 09/17/19 1245 09/17/19 1300 09/17/19 1600  BP:   114/61 (!) 114/96  Pulse: 82 73 73 79  Resp: 15 (!) 21 15 20   Temp:      TempSrc:      SpO2: 96% 96% 97% 94%     . General:  Appears calm and comfortable and is NAD, he appears frustrated . Eyes:  PERRL, EOMI, normal lids, iris . ENT:  grossly normal hearing, lips & tongue, mmm; edentulous Neck:  no LAD, masses or thyromegaly; no carotid bruits . Cardiovascular:  RRR, no m/r/g. 1-2+ LE edema.  Marland Kitchen Respiratory:   CTA bilaterally with no wheezes/rales/rhonchi.  Normal respiratory effort. . Abdomen:  soft, NT, ND, NABS . Skin:  no rash or induration seen on limited exam . Musculoskeletal:  grossly normal tone BUE/BLE, good ROM, no bony abnormality . Psychiatric:  grossly normal mood and affect, speech fluent and appropriate, AOx3 . Neurologic:  CN 2-12 grossly intact, moves all extremities in coordinated fashion    Radiological Exams on Admission: US RENAL  Result Date: 09/17/2019 CLINICAL DATA:  Acute kidney injury. EXAM: RENAL / URINARY TRACT ULTRASOUND COMPLETE COMPARISON:  None. FINDINGS: Right Kidney: Renal measurements: 11.0 x 5.7 x 5.9 cm = volume: 194.2 mL. The renal cortex appears thinned with increased echogenicity. No mass or hydronephrosis visualized. Left Kidney: Renal measurements: 10.4 x 5.5 x 4.6 cm = volume: 131.6 mL. There is mild cortical thinning and increased echogenicity. No mass or hydronephrosis visualized. Bladder: Appears normal for degree of bladder distention. Other: None. IMPRESSION: Negative for hydronephrosis or acute abnormality. Cortical thinning and  increased echogenicity in both kidneys appear worse on the right. Electronically Signed   By: Inge Rise M.D.   On: 09/17/2019 11:39   DG Chest Portable 1 View  Result Date: 09/17/2019 CLINICAL DATA:  Shortness of breath EXAM: PORTABLE CHEST 1 VIEW COMPARISON:  08/15/2019 FINDINGS: The heart size and mediastinal contours are within normal limits. Both lungs are clear. The visualized skeletal structures are unremarkable. IMPRESSION: No active disease. Electronically Signed   By: Constance Holster M.D.   On: 09/17/2019 02:56   ECHOCARDIOGRAM COMPLETE  Result Date: 09/17/2019    ECHOCARDIOGRAM REPORT   Patient Name:   Jaris Rosekrans  Date of Exam: 09/17/2019 Medical Rec #:  [id number]   Height:       72.0 in Accession #:    [id number]  Weight:       228.0 lb Date of Birth:  [birth year]  BSA:          2.252 m Patient Age:    72 years    BP:           114/61 mmHg Patient Gender: M           HR:           73 bpm. Exam Location:  Inpatient Procedure: 2D Echo Indications:    786.09 dyspnea  History:        Patient has no prior history of Echocardiogram examinations.                 Risk Factors:Hypertension, Dyslipidemia and Former Smoker.  Sonographer:    Jannett Celestine RDCS (AE) Referring Phys: [id number] Amery Vandenbos  Sonographer Comments: Suboptimal apical window and no parasternal window. Image acquisition challenging due to respiratory motion and Image acquisition challenging due to patient body habitus. IMPRESSIONS  1. Left ventricular ejection fraction, by estimation, is 55 to 60%. The left ventricle has hyperdynamic function. The left ventricle has no regional wall motion abnormalities. Left ventricular diastolic parameters are consistent with Grade I diastolic dysfunction (impaired relaxation).  2. Right ventricular systolic function is normal. The right ventricular size is normal. Tricuspid regurgitation signal is inadequate for assessing PA pressure.  3. The mitral valve is grossly normal. No evidence of mitral  valve regurgitation.  4. The aortic valve is grossly normal. Aortic valve regurgitation is not visualized. No aortic stenosis is present. Comparison(s): No prior Echocardiogram. FINDINGS  Left Ventricle: Left ventricular ejection fraction, by estimation, is 55 to 60%. The left ventricle has hyperdynamic function. The left ventricle has no regional wall motion abnormalities. The left ventricular internal cavity size was normal in size. There is no left ventricular hypertrophy. Left ventricular diastolic parameters are consistent with Grade I diastolic dysfunction (impaired relaxation). Normal left ventricular filling pressure. Right Ventricle: The right ventricular size is normal. No increase in right ventricular wall thickness. Right ventricular systolic function is normal. Tricuspid regurgitation signal is inadequate for assessing PA pressure. Left Atrium: Left atrial size was normal in size. Right Atrium: Right atrial size was normal in size. Pericardium: There is no evidence of pericardial effusion. Mitral Valve: The mitral valve is grossly normal. No evidence of mitral valve regurgitation. Tricuspid Valve: The tricuspid valve is normal in structure. Tricuspid valve regurgitation is not demonstrated. Aortic Valve: The aortic valve is grossly normal. Aortic valve regurgitation is not visualized. No aortic stenosis is present. Pulmonic Valve: The pulmonic valve was not well visualized. Pulmonic valve regurgitation is not visualized. Aorta: The aortic root was not well visualized. IAS/Shunts: The interatrial septum was not assessed.   Diastology LV e' lateral:   11.00 cm/s LV E/e' lateral: 4.2 LV e' medial:    6.09 cm/s LV E/e' medial:  7.6  RIGHT VENTRICLE TAPSE (M-mode): 2.1 cm LEFT ATRIUM           Index LA Vol (A4C): 46.2 ml 20.51 ml/m  AORTIC VALVE LVOT Vmax:   83.60 cm/s LVOT Vmean:  60.600 cm/s LVOT VTI:    0.184 m MITRAL VALVE MV Area (PHT): 2.54 cm    SHUNTS MV Decel Time: 299 msec    Systemic VTI: 0.18 m  MV E velocity: 46.30 cm/s MV A  velocity: 62.10 cm/s MV E/A ratio:  0.75 Mihai Croitoru MD Electronically signed by Sanda Klein MD Signature Date/Time: 09/17/2019/3:13:24 PM    Final    CT MAXILLOFACIAL WO CONTRAST  Result Date: 09/17/2019 CLINICAL DATA:  Sinus congestion. EXAM: CT MAXILLOFACIAL WITHOUT CONTRAST TECHNIQUE: Multidetector CT imaging of the maxillofacial structures was performed. Multiplanar CT image reconstructions were also generated. COMPARISON:  August 15, 2019. FINDINGS: Osseous: No fracture or mandibular dislocation. No destructive process. Orbits: Negative. No traumatic or inflammatory finding. Sinuses: There is no evidence of sinusitis. Soft tissues: Negative. Limited intracranial: No significant or unexpected finding. IMPRESSION: No evidence of sinusitis. Electronically Signed   By: Marijo Conception M.D.   On: 09/17/2019 11:30    EKG: Independently reviewed.   2006 - Sinus tachycardia with rate 107; nonspecific ST changes 0328 - NSR with rate 74; no evidence of acute ischemia   Labs on Admission: I have personally reviewed the available labs and imaging studies at the time of the admission.  Pertinent labs:   CO2 20 Glucose 195 BUN 31/Creatinine 1.95/GFR 36; 25/1.55/47 on 2/4 Normal CBC HS troponin 3 BD COVID negative; Hologic COVID pending Lipids: 150/29/86/176 A1c 6.1 TSH 2.456 HIV negative COVID negative   Assessment/Plan Principal Problem:   AKI (acute kidney injury) ([place]) Active Problems:   Neuropathy   Hypertension   Dyslipidemia   BPH with elevated PSA   Chronic diastolic CHF (congestive heart failure) ([place])   AKI -Patient with prior mild renal dysfunction, presenting with recurrent dizziness and various other complaints -Renal function worse than a month ago -He was given IVF but volume overload was also considered - although he does not appear edematous -Will slow hydration, follow creatinine -Will admit, as this appears to require more  evaluation -Will order FeNa -will order renal US to assess for obstruction (concern for elevated PSA) and chronic renal disease - [place] appears to show CKD -Will hold ACE for now and consider restarting if renal function stabilizes  Chronic diastolic CHF -Echo was ordered given recurrent cough and congestion -No sinus disease on CT -Grade 1 diastolic dysfunction seen on echo with preserved EF -Will give ASA  HTN -Suspect long-standing and uncontrolled HTN -Hold ACE and HCTZ for now -Consider an alternative agent tomorrow, BP currently reasonably well controlled  HLD -Continue Lipitor  BPH with elevated PSA -Has outpatient referral to urology -Reports PSA is about 10 -Likely needs biopsy but does not appear to be acutely in need of attention unless he has apparent obstruction on [place] (not present) -Continue Flomax   Note: This patient has been tested and is negative for the novel coronavirus COVID-19.  DVT prophylaxis:   Lovenox Code Status:  Full - confirmed with patient/family Family Communication: None present; I was unable to reach his girlfriend at the time of admission. Disposition Plan: She is anticipated to d/c to home without [facility] services once her cardiology issues have been resolved. Consults called: None  Admission status: Admit - It is my clinical opinion that admission to INPATIENT is reasonable and necessary because of the expectation that this patient will require hospital care that crosses at least 2 midnights to treat this condition based on the medical complexity of the problems presented.  Given the aforementioned information, the predictability of an adverse outcome is felt to be significant.    Karmen Bongo MD [facility]   How to contact the [facility] Attending or Consulting provider [place] or covering provider during after hours [place], for  this patient?  1. Check the care team in [facility] and look for a) attending/consulting [facility] provider listed and b) the [facility]  team listed 2. Log into [email or web address] and use [place]'s universal password to access. If you do not have the password, please contact the hospital operator. 3. Locate the [facility] provider you are looking for under [facility] and page to a number that you can be directly reached. 4. If you still have difficulty reaching the provider, please page the [facility] (Director on Call) for the Hospitalists listed on amion for assistance.   09/17/2019, 6:08 PM

## 2019-09-17 NOTE — ED Notes (Signed)
Care endorsed to Kathlee Nations, [place]

## 2019-09-17 NOTE — ED Notes (Signed)
Dr. Ward at bedside.

## 2019-09-17 NOTE — ED Notes (Signed)
cxr at bedside

## 2019-09-17 NOTE — ED Notes (Signed)
Pt ambulatory in room. Pulse ox remained between 95-98% entire time. Pt did endorse feeling SOB

## 2019-09-18 ENCOUNTER — Inpatient Hospital Stay (HOSPITAL_COMMUNITY)

## 2019-09-18 ENCOUNTER — Other Ambulatory Visit: Payer: Self-pay

## 2019-09-18 ENCOUNTER — Encounter (HOSPITAL_COMMUNITY): Payer: Self-pay | Admitting: Internal Medicine

## 2019-09-18 DIAGNOSIS — N179 Acute kidney failure, unspecified: Secondary | ICD-10-CM | POA: Diagnosis not present

## 2019-09-18 DIAGNOSIS — G629 Polyneuropathy, unspecified: Secondary | ICD-10-CM

## 2019-09-18 DIAGNOSIS — I5032 Chronic diastolic (congestive) heart failure: Secondary | ICD-10-CM | POA: Diagnosis not present

## 2019-09-18 DIAGNOSIS — E785 Hyperlipidemia, unspecified: Secondary | ICD-10-CM | POA: Diagnosis not present

## 2019-09-18 DIAGNOSIS — R972 Elevated prostate specific antigen [PSA]: Secondary | ICD-10-CM

## 2019-09-18 DIAGNOSIS — N400 Enlarged prostate without lower urinary tract symptoms: Secondary | ICD-10-CM

## 2019-09-18 DIAGNOSIS — I10 Essential (primary) hypertension: Secondary | ICD-10-CM

## 2019-09-18 LAB — BASIC METABOLIC PANEL
Anion gap: 8 (ref 5–15)
BUN: 25 mg/dL — ABNORMAL HIGH (ref 8–23)
CO2: 22 mmol/L (ref 22–32)
Calcium: 9 mg/dL (ref 8.9–10.3)
Chloride: 107 mmol/L (ref 98–111)
Creatinine, Ser: 1.59 mg/dL — ABNORMAL HIGH (ref 0.61–1.24)
GFR calc Af Amer: 53 mL/min — ABNORMAL LOW (ref >60)
GFR calc non Af Amer: 46 mL/min — ABNORMAL LOW (ref >60)
Glucose, Bld: 87 mg/dL (ref 70–99)
Potassium: 4.5 mmol/L (ref 3.5–5.1)
Sodium: 137 mmol/L (ref 135–145)

## 2019-09-18 LAB — CBC
HCT: 41.3 % (ref 39.0–52.0)
Hemoglobin: 13.7 g/dL (ref 13.0–17.0)
MCH: 28.5 pg (ref 26.0–34.0)
MCHC: 33.2 g/dL (ref 30.0–36.0)
MCV: 86 fL (ref 80.0–100.0)
Platelets: 142 10*3/uL — ABNORMAL LOW (ref 150–400)
RBC: 4.8 MIL/uL (ref 4.22–5.81)
RDW: 12.8 % (ref 11.5–15.5)
WBC: 7.1 10*3/uL (ref 4.0–10.5)
nRBC: 0 % (ref 0.0–0.2)

## 2019-09-18 LAB — PSA: Prostatic Specific Antigen: 11.87 ng/mL — ABNORMAL HIGH (ref 0.00–4.00)

## 2019-09-18 LAB — D-DIMER, QUANTITATIVE: D-Dimer, Quant: 0.48 ug/mL-FEU (ref 0.00–0.50)

## 2019-09-18 MED ORDER — INFLUENZA VAC SPLIT QUAD 0.5 ML IM SUSY
0.5000 mL | PREFILLED_SYRINGE | INTRAMUSCULAR | Status: AC
  Administered 2019-09-19: 0.5 mL via INTRAMUSCULAR
  Filled 2019-09-18: qty 0.5

## 2019-09-18 MED ORDER — TECHNETIUM TO 99M ALBUMIN AGGREGATED
1.6000 | Freq: Once | INTRAVENOUS | Status: AC | PRN
  Administered 2019-09-18: 1.6 via INTRAVENOUS

## 2019-09-18 MED ORDER — MECLIZINE HCL 25 MG PO TABS
25.0000 mg | ORAL_TABLET | Freq: Three times a day (TID) | ORAL | Status: DC
  Administered 2019-09-18 – 2019-09-20 (×6): 25 mg via ORAL
  Filled 2019-09-18 (×9): qty 1

## 2019-09-18 NOTE — Progress Notes (Signed)
PROGRESS NOTE    Adrian Herrera  ZD:[id number] DOB: 1956/03/10 DOA: 09/16/2019 PCP: System, Pcp Not In    Brief Narrative:  Patient was admitted to the hospital with working diagnosis acute prerenal kidney injury  64 year old male who presented with dizziness.  He does have significant past medical history for hypertension and dyslipidemia.  Patient initially seen at the emergency department 08/15/2019 for same complaint, apparently failed outpatient therapy.  Continue to felt dizzy especially while standing or bending over.  Positive for orthopnea, and mild lower extremity edema.  Positive dyspnea on exertion.  On his initial physical examination blood pressure 114/61, heart rate 73, respirate 15, oxygen saturation 94%, his lungs are clear to auscultation bilaterally, heart S1-S2 present rhythmic, his abdomen was soft, positive lower extremity edema. Sodium 139, potassium 4.5, chloride 109, bicarb 20, glucose 195, BUN 31, creatinine 1.95, wbc 7,1, hemoglobin 14.5, hematocrit 43.2, platelets 163.  COVID-19 negative.  Chest radiograph negative for infiltrates.  EKG 107 bpm, normal axis, normal intervals, sinus rhythm, no ST segment or T wave changes.     Assessment & Plan:   Principal Problem:   AKI (acute kidney injury) ([place]) Active Problems:   Neuropathy   Hypertension   Dyslipidemia   BPH with elevated PSA   Chronic diastolic CHF (congestive heart failure) ([place])   1. AKI. Renal function with serum cr at 1,59 with K at 4,5. Will continue gentle hydration with IV fluids and will follow on renal panel in am, avoid hypotension and nephrotoxic medications.   2. Diastolic heart failure. No signs of acute exacerbation, clinically euvolemic.   Patient continue to have dyspnea on exertion, his chest film has no infiltrates. Further work up with D dimer and CT chest angio plus lower extremities [place]. Patient had significant decreased mobility over last 2 mo due to vertigo.   3. HTN. Continue  blood pressure monitoring   4. Dyslipidemia. Continue with statin therapy.   5. BPH with elevated PSA. Patient follows with urology as outpatient, will order PSA.   5. Tinitus and vertigo, meniere disease. Will add meclizine tid, follow with pt and ot recommendations.   DVT prophylaxis: enoxaparin   Code Status:  full Family Communication: no family at the bedside  Disposition Plan/ discharge barriers: patient coming from home, barrier for discharge dyspnea on exertion and vertigo, disposition pending PT evaluation    Subjective: Patient continue to have dyspnea, worse with exertion, no nausea or vomiting, no cough or wheezing. No chest pain. Patient follows with urology as outpatient, follows PSA, at one point in time he was told about possible prostate biopsy.   Objective: Vitals:   09/17/19 1600 09/17/19 1830 09/17/19 2136 09/18/19 0506  BP: (!) 114/96 126/75 124/85 109/84  Pulse: 79 79 85 73  Resp: 20 19 18 18   Temp:  97.6 F (36.4 C) 97.7 F (36.5 C) 97.6 F (36.4 C)  TempSrc:  Axillary Oral Oral  SpO2: 94% 99% 98% 97%    Intake/Output Summary (Last 24 hours) at 09/18/2019 1408 Last data filed at 09/18/2019 1038 Gross per 24 hour  Intake 1554.37 ml  Output --  Net 1554.37 ml   There were no vitals filed for this visit.  Examination:   General: deconditioned  Neurology: Awake and alert, non focal  E ENT: no pallor, no icterus, oral mucosa moist Cardiovascular: No JVD. S1-S2 present, rhythmic, no gallops, rubs, or murmurs. No lower extremity edema. Pulmonary: positive breath sounds bilaterally, adequate air movement, no wheezing, rhonchi or rales.  Gastrointestinal. Abdomen protuberant with no organomegaly, non tender, no rebound or guarding Skin. No rashes Musculoskeletal: no joint deformities     Data Reviewed: I have personally reviewed following labs and imaging studies  CBC: Recent Labs  Lab 09/16/19 2028 09/18/19 0219  WBC 7.1 7.1  NEUTROABS 5.0  --    HGB 14.5 13.7  HCT 43.2 41.3  MCV 85.4 86.0  PLT 163 [id number]*   Basic Metabolic Panel: Recent Labs  Lab 09/16/19 2028 09/18/19 0219  NA 139 137  K 4.5 4.5  CL 109 107  CO2 20* 22  GLUCOSE 195* 87  BUN 31* 25*  CREATININE 1.95* 1.59*  CALCIUM 9.3 9.0   GFR: CrCl cannot be calculated (Unknown ideal weight.). Liver Function Tests: Recent Labs  Lab 09/16/19 2028  AST 21  ALT 25  ALKPHOS 59  BILITOT 0.4  PROT 6.6  ALBUMIN 3.8   No results for input(s): LIPASE, AMYLASE in the last 168 hours. No results for input(s): AMMONIA in the last 168 hours. Coagulation Profile: No results for input(s): INR, PROTIME in the last 168 hours. Cardiac Enzymes: No results for input(s): CKTOTAL, CKMB, CKMBINDEX, TROPONINI in the last 168 hours. BNP (last 3 results) No results for input(s): PROBNP in the last 8760 hours. HbA1C: Recent Labs    09/17/19 1609  HGBA1C 6.1*   CBG: No results for input(s): GLUCAP in the last 168 hours. Lipid Profile: Recent Labs    09/17/19 1612  CHOL 150  HDL 29*  LDLCALC 86  TRIG 176*  CHOLHDL 5.2   Thyroid Function Tests: Recent Labs    09/17/19 1611  TSH 2.456   Anemia Panel: No results for input(s): VITAMINB12, FOLATE, FERRITIN, TIBC, IRON, RETICCTPCT in the last 72 hours.    Radiology Studies: I have reviewed all of the imaging during this hospital visit personally     Scheduled Meds: . aspirin EC  81 mg Oral Daily  . atorvastatin  10 mg Oral Daily  . docusate sodium  100 mg Oral BID  . DULoxetine  30 mg Oral BID  . enoxaparin (LOVENOX) injection  40 mg Subcutaneous Q24H  . [START ON 09/19/2019] influenza vac split quadrivalent PF  0.5 mL Intramuscular Tomorrow-1000  . sodium chloride flush  3 mL Intravenous Q12H  . tamsulosin  0.4 mg Oral Daily   Continuous Infusions: . sodium chloride 75 mL/hr at 09/18/19 0300     LOS: 1 day        Jacqueline Spofford Gerome Apley, MD

## 2019-09-19 ENCOUNTER — Ambulatory Visit (HOSPITAL_BASED_OUTPATIENT_CLINIC_OR_DEPARTMENT_OTHER)

## 2019-09-19 DIAGNOSIS — I5032 Chronic diastolic (congestive) heart failure: Secondary | ICD-10-CM | POA: Diagnosis not present

## 2019-09-19 DIAGNOSIS — N179 Acute kidney failure, unspecified: Secondary | ICD-10-CM | POA: Diagnosis not present

## 2019-09-19 DIAGNOSIS — R609 Edema, unspecified: Secondary | ICD-10-CM | POA: Diagnosis not present

## 2019-09-19 DIAGNOSIS — N400 Enlarged prostate without lower urinary tract symptoms: Secondary | ICD-10-CM | POA: Diagnosis not present

## 2019-09-19 DIAGNOSIS — E785 Hyperlipidemia, unspecified: Secondary | ICD-10-CM | POA: Diagnosis not present

## 2019-09-19 LAB — HEPATITIS C VRS RNA DETECT BY PCR-QUAL: Hepatitis C Vrs RNA by PCR-Qual: NEGATIVE

## 2019-09-19 MED ORDER — DIPHENHYDRAMINE HCL 25 MG PO CAPS: 25.0000 mg | ORAL_CAPSULE | Freq: Three times a day (TID) | ORAL | Status: DC

## 2019-09-19 NOTE — Evaluation (Signed)
Physical Therapy Evaluation Patient Details Name: Adrian Herrera MRN: [id number] DOB: 08-Dec-1955 Today's Date: 09/19/2019   History of Present Illness  Pt is 64 yo male who presented to ED with dizziness. PMH includes HTN and dyslipidemia.  Pt was admitted with AKI and potential Menieres disease.  Clinical Impression   Pt admitted with above diagnosis. Upon arrival pt up ambulating independently in room and reports walked in hall earlier but did feel weak.  Pt's dizziness appears to likely be multifactorial.  He did have orthostatic hypotension (notified RN), but also had dizziness with gaze stabilization, smooth pursuit, and L [employer].  However, no nystagmus.  Pt additionally has tinnitus and had recent sinus issues.  Will benefit from PT for further vestibular management.  Would also benefit from higher level balance assessment (unable today after vestibular testing).  Pt currently with functional limitations due to the deficits listed below (see PT Problem List). Pt will benefit from skilled PT to increase their independence and safety with mobility to allow discharge to the venue listed below.       Follow Up Recommendations Outpatient PT((for vestibular rehab))    Equipment Recommendations  None recommended by PT    Recommendations for Other Services       Precautions / Restrictions Precautions Precautions: None      Mobility  Bed Mobility Overal bed mobility: Independent                Transfers Overall transfer level: Independent                  Ambulation/Gait Ambulation/Gait assistance: Independent           General Gait Details: pt ambulated to vending machines earlier (at least 400') independently; did report fatigue that is not normal for him.  Also, reports just showered independently and washed hair.  Stairs            Wheelchair Mobility    Modified Rankin (Stroke Patients Only)       Balance Overall balance assessment: Needs  assistance   Sitting balance-Leahy Scale: Normal     Standing balance support: No upper extremity supported;During functional activity Standing balance-Leahy Scale: Normal                               Pertinent Vitals/Pain Pain Assessment: No/denies pain    Home Living Family/patient expects to be discharged to:: Private residence Living Arrangements: Alone   Type of Home: House Home Access: Stairs to enter Entrance Stairs-Rails: Psychiatric nurse of Steps: 3 Home Layout: One level Home Equipment: None      Prior Function Level of Independence: Independent         Comments: Pt works in [employer], clinical        Extremity/Trunk Assessment   Upper Extremity Assessment Upper Extremity Assessment: Overall WFL for tasks assessed    Lower Extremity Assessment Lower Extremity Assessment: Overall WFL for tasks assessed       Communication   Communication: No difficulties  Cognition Arousal/Alertness: Awake/alert Behavior During Therapy: WFL for tasks assessed/performed Overall Cognitive Status: Within Functional Limits for tasks assessed                                        General Comments  Vestibular:  Pt reports  that he has had some dizziness since the end of January.  Reports that symptoms feel like he is spinning and sometimes like he will pass out.  Additionally, reports just feeling weak.  States that symptoms worse when he sits up and stands or bends over.  Additionally, reports tinnitus that began at same time.  Reports recent sinus issues.   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   With PT:  Orthostatic bps as follows: Supine 113/85 and HR 84 Sit 104/90 and HR 89 (symptomatic) Stand 91/77 and HR 110 (symptomatic) Stand 3 mins 109/96 and HR 108 Pt reports systolic BP typically in  130's  Vestibular testing: Smooth Pursuit: intact, reports min dizziness, no nystagmus Gaze Stabilization: Intact, reports min dizziness, no nystagmus Head Thrust: Intact, reports min dizziness, no nystagmus [employer]: reports dizziness with L sided testing - but no nystagmus; reports not as bad on R  Pt reports feels weak and shakey after all the testing.   Gave pt HEP: Habituation of smooth pursuit and gaze stabilization in sitting and L Epley's maneuvar Discussed recommendation for follow up with outpatient PT for vestibular/balance     Exercises     Assessment/Plan    PT Assessment Patient needs continued PT services  PT Problem List Decreased mobility;Decreased activity tolerance;Decreased balance       PT Treatment Interventions Therapeutic activities;Gait training;Therapeutic exercise;Patient/family education;Stair training;Balance training;Functional mobility training;Neuromuscular re-education    PT Goals (Current goals can be found in the Care Plan section)  Acute Rehab PT Goals Patient Stated Goal: return to PLOF; not feel so weak PT Goal Formulation: With patient Time For Goal Achievement: 10/03/19 Potential to Achieve Goals: Good Additional Goals Additional Goal #1: Pt will have minimal dizziness with transfers for improved safety.    Frequency Min 3X/week   Barriers to discharge        Co-evaluation               AM-PAC PT "6 Clicks" Mobility  Outcome Measure Help needed turning from your back to your side while in a flat bed without using bedrails?: None Help needed moving from lying on your back to sitting on the side of a flat bed without using bedrails?: None Help needed moving to and from a bed to a chair (including a wheelchair)?: None Help needed standing up from a chair using your arms (e.g., wheelchair or bedside chair)?: None Help needed to walk in hospital room?: None Help needed climbing 3-5 steps with a railing? : None 6 Click  Score: 24    End of Session   Activity Tolerance: Patient tolerated treatment well Patient left: in bed;with call bell/phone within reach Nurse Communication: Mobility status(orthostatic hypotension) PT Visit Diagnosis: Dizziness and giddiness (R42)    Time: [id number] PT Time Calculation (min) (ACUTE ONLY): 60 min   Charges:   PT Evaluation $PT Eval Moderate Complexity: 1 Mod PT Treatments $Therapeutic Exercise: 8-22 mins        Maggie Font, PT Acute Rehab Services Pager [phone] [facility] [phone] [facility] 09/19/2019, 6:11 PM

## 2019-09-19 NOTE — Progress Notes (Addendum)
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   64 year old male who presented with dizziness.  He does have significant past medical history for hypertension and dyslipidemia.  Patient initially seen at the emergency department 08/15/2019 for same complaint, apparently failed outpatient therapy.  Continue to felt dizzy especially while standing or bending over.  Positive for orthopnea, and mild lower extremity edema.  Positive dyspnea on exertion.  On his initial physical examination blood pressure 114/61, heart rate 73, respirate 15, oxygen saturation 94%, his lungs are clear to auscultation bilaterally, heart S1-S2 present rhythmic, his abdomen was soft, positive lower extremity edema. Sodium 139, potassium 4.5, chloride 109, bicarb 20, glucose 195, BUN 31, creatinine 1.95, wbc 7,1, hemoglobin 14.5, hematocrit 43.2, platelets 163.  COVID-19 negative.  Chest radiograph negative for infiltrates.  EKG 107 bpm, normal axis, normal intervals, sinus rhythm, no ST segment or T wave changes.   Patient had further work up for dyspnea on exertion with V/Q scan and echocardiography, negative for pulmonary embolism and systolic heart failure respectively.   Symptoms with mild improvement with meclizine. His renal function is improving with IV fluids.   Assessment & Plan:   Principal Problem:   AKI (acute kidney injury) ([place]) Active Problems:   Neuropathy   Hypertension   Dyslipidemia   BPH with elevated PSA   Chronic diastolic CHF (congestive heart failure) ([place])   Acute kidney injury ([place])   1. AKI. His renal function has been improving, will dc IV fluids now and will check follow up renal function in am. Patient with no nausea or vomiting.    2. Diastolic heart failure. Echocardiogram with  preserved LV systolic function. No indication for diuretic therapy at this point. Will dc IV fluids.   3. HTN. Blood pressure is controlled.  4. Dyslipidemia. On atorvastatin.   5. BPH with elevated PSA. PSA is 11,8, will need outpatient follow up with urology, currently no signs of urinary retention. Continue with tamsulosin.   5. 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 Symptoms with mild improvement, will continue meclizine. Pending PT and OT evaluations, patient continue to be very weak and deconditioned. Patient will need ENT evaluation as outpatient.   6. Depression. Continue with duloxetine.    DVT prophylaxis: enoxaparin   Code Status:  full Family Communication: no family at the bedside  Disposition Plan/ discharge barriers: patient coming from home, barrier for discharge dyspnea on exertion and vertigo/ ambulatory dysfunction, disposition pending PT/OT evaluation     Subjective: Patient continue to have tinnitus and vertigo, worse with movement and associated with fullness in ears bilaterally, no nausea or vomiting, no chest pain.   Objective: Vitals:   09/18/19 1548 09/18/19 2117 09/19/19 0436 09/19/19 1343  BP: 130/87 121/87 115/77 131/87  Pulse: (!) 103 84 71 80  Resp: 18 18 17 18   Temp: 99.5 F (37.5 C) 98.7 F (37.1 C) 97.9 F (36.6 C) 97.9 F (36.6 C)  TempSrc: Oral Oral Oral Oral  SpO2: 94% 97% 97% 97%    Intake/Output Summary (Last 24 hours) at 09/19/2019 1432 Last data filed at 09/19/2019 1300 Gross per 24 hour  Intake 2268.96 ml  Output --  Net 2268.96 ml   There were no vitals filed for this visit.  Examination:   General: deconditioned  Neurology: Awake  and alert, non focal  E ENT: no pallor, no icterus, oral mucosa moist Cardiovascular: No JVD. S1-S2 present, rhythmic, no gallops, rubs, or murmurs. No lower extremity edema. Pulmonary: positive breath sounds bilaterally, adequate air movement, no wheezing, rhonchi or  rales. Gastrointestinal. Abdomen with no organomegaly, non tender, no rebound or guarding Skin. No rashes Musculoskeletal: no joint deformities     Data Reviewed: I have personally reviewed following labs and imaging studies  CBC: Recent Labs  Lab 09/16/19 2028 09/18/19 0219  WBC 7.1 7.1  NEUTROABS 5.0  --   HGB 14.5 13.7  HCT 43.2 41.3  MCV 85.4 86.0  PLT 163 [id number]*   Basic Metabolic Panel: Recent Labs  Lab 09/16/19 2028 09/18/19 0219  NA 139 137  K 4.5 4.5  CL 109 107  CO2 20* 22  GLUCOSE 195* 87  BUN 31* 25*  CREATININE 1.95* 1.59*  CALCIUM 9.3 9.0   GFR: CrCl cannot be calculated (Unknown ideal weight.). Liver Function Tests: Recent Labs  Lab 09/16/19 2028  AST 21  ALT 25  ALKPHOS 59  BILITOT 0.4  PROT 6.6  ALBUMIN 3.8   No results for input(s): LIPASE, AMYLASE in the last 168 hours. No results for input(s): AMMONIA in the last 168 hours. Coagulation Profile: No results for input(s): INR, PROTIME in the last 168 hours. Cardiac Enzymes: No results for input(s): CKTOTAL, CKMB, CKMBINDEX, TROPONINI in the last 168 hours. BNP (last 3 results) No results for input(s): PROBNP in the last 8760 hours. HbA1C: Recent Labs    09/17/19 1609  HGBA1C 6.1*   CBG: No results for input(s): GLUCAP in the last 168 hours. Lipid Profile: Recent Labs    09/17/19 1612  CHOL 150  HDL 29*  LDLCALC 86  TRIG 176*  CHOLHDL 5.2   Thyroid Function Tests: Recent Labs    09/17/19 1611  TSH 2.456   Anemia Panel: No results for input(s): VITAMINB12, FOLATE, FERRITIN, TIBC, IRON, RETICCTPCT in the last 72 hours.    Radiology Studies: I have reviewed all of the imaging during this hospital visit personally     Scheduled Meds: . aspirin EC  81 mg Oral Daily  . atorvastatin  10 mg Oral Daily  . docusate sodium  100 mg Oral BID  . DULoxetine  30 mg Oral BID  . enoxaparin (LOVENOX) injection  40 mg Subcutaneous Q24H  . meclizine  25 mg Oral TID  . sodium  chloride flush  3 mL Intravenous Q12H  . tamsulosin  0.4 mg Oral Daily   Continuous Infusions: . sodium chloride 50 mL/hr at 09/19/19 0406     LOS: 1 day        Haillie Radu Gerome Apley, MD

## 2019-09-19 NOTE — Progress Notes (Signed)
Bilateral lower extremity venous duplex has been completed. Preliminary results can be found in CV Proc through chart review.   09/19/19 11:43 AM Carlos Levering RVT

## 2019-09-20 DIAGNOSIS — N1831 Chronic kidney disease, stage 3a: Secondary | ICD-10-CM | POA: Diagnosis present

## 2019-09-20 DIAGNOSIS — N400 Enlarged prostate without lower urinary tract symptoms: Secondary | ICD-10-CM | POA: Diagnosis present

## 2019-09-20 DIAGNOSIS — E785 Hyperlipidemia, unspecified: Secondary | ICD-10-CM | POA: Diagnosis present

## 2019-09-20 DIAGNOSIS — I130 Hypertensive heart and chronic kidney disease with heart failure and stage 1 through stage 4 chronic kidney disease, or unspecified chronic kidney disease: Secondary | ICD-10-CM | POA: Diagnosis present

## 2019-09-20 DIAGNOSIS — Z20822 Contact with and (suspected) exposure to covid-19: Secondary | ICD-10-CM | POA: Diagnosis present

## 2019-09-20 DIAGNOSIS — N179 Acute kidney failure, unspecified: Secondary | ICD-10-CM | POA: Diagnosis present

## 2019-09-20 DIAGNOSIS — Z8249 Family history of ischemic heart disease and other diseases of the circulatory system: Secondary | ICD-10-CM | POA: Diagnosis not present

## 2019-09-20 DIAGNOSIS — Z23 Encounter for immunization: Secondary | ICD-10-CM | POA: Diagnosis not present

## 2019-09-20 DIAGNOSIS — I5032 Chronic diastolic (congestive) heart failure: Secondary | ICD-10-CM | POA: Diagnosis present

## 2019-09-20 DIAGNOSIS — G629 Polyneuropathy, unspecified: Secondary | ICD-10-CM | POA: Diagnosis present

## 2019-09-20 DIAGNOSIS — H8109 Meniere's disease, unspecified ear: Secondary | ICD-10-CM | POA: Diagnosis present

## 2019-09-20 DIAGNOSIS — F329 Major depressive disorder, single episode, unspecified: Secondary | ICD-10-CM | POA: Diagnosis present

## 2019-09-20 DIAGNOSIS — Z820 Family history of epilepsy and other diseases of the nervous system: Secondary | ICD-10-CM | POA: Diagnosis not present

## 2019-09-20 DIAGNOSIS — Z87891 Personal history of nicotine dependence: Secondary | ICD-10-CM | POA: Diagnosis not present

## 2019-09-20 DIAGNOSIS — R0602 Shortness of breath: Secondary | ICD-10-CM | POA: Diagnosis present

## 2019-09-20 LAB — BASIC METABOLIC PANEL
Anion gap: 13 (ref 5–15)
BUN: 20 mg/dL (ref 8–23)
CO2: 22 mmol/L (ref 22–32)
Calcium: 9.3 mg/dL (ref 8.9–10.3)
Chloride: 103 mmol/L (ref 98–111)
Creatinine, Ser: 1.5 mg/dL — ABNORMAL HIGH (ref 0.61–1.24)
GFR calc Af Amer: 57 mL/min — ABNORMAL LOW (ref >60)
GFR calc non Af Amer: 49 mL/min — ABNORMAL LOW (ref >60)
Glucose, Bld: 91 mg/dL (ref 70–99)
Potassium: 4.5 mmol/L (ref 3.5–5.1)
Sodium: 138 mmol/L (ref 135–145)

## 2019-09-20 MED ORDER — LISINOPRIL 10 MG PO TABS: 10.0000 mg | ORAL_TABLET | Freq: Every day | ORAL | 0 refills | Status: DC

## 2019-09-20 MED ORDER — MECLIZINE HCL 25 MG PO TABS: 25.0000 mg | ORAL_TABLET | Freq: Three times a day (TID) | ORAL | 0 refills | Status: DC | PRN

## 2019-09-20 NOTE — TOC Transition Note (Signed)
Transition of Care [facility]) - CM/SW Discharge Note   Patient Details  Name: Adrian Herrera MRN: [id number] Date of Birth: [birth year]  Transition of Care [facility]) CM/SW Contact:  Marilu Favre, RN Phone Number: 09/20/2019, 11:28 AM   Clinical Narrative:     PAtient from half way house. Provided cab voucher for return today. Nurse will call cab when ready to discharge.   Patient's case worker is Awilda Bill at [employer].   MD entered order for OP PT. Patient aware.   Left message with Ms Gloriann Loan regarding transportation for OP PT   Final next level of care: Home/Self Care Barriers to Discharge: No Barriers Identified   Patient Goals and CMS Choice Patient states their goals for this hospitalization and ongoing recovery are:: to go home CMS Medicare.gov Compare Post Acute Care list provided to:: Patient Choice offered to / list presented to : NA  Discharge Placement                       Discharge Plan and Services   Discharge Planning Services: CM Consult            DME Arranged: N/A         HH Arranged: NA          Social Determinants of Health (SDOH) Interventions     Readmission Risk Interventions No flowsheet data found.

## 2019-09-20 NOTE — Discharge Summary (Signed)
Physician Discharge Summary  Adrian Herrera [id number] DOB: 25-Jul-1955 DOA: 09/16/2019  PCP: System, Pcp Not In  Admit date: 09/16/2019 Discharge date: 09/20/2019  Admitted From: Home  Disposition:  Home   Recommendations for Outpatient Follow-up and new medication changes:  1. Follow up with Primary Care in 7 days. 2. Follow up with ENT as scheduled.  3. Follow up with Urology as scheduled. 4. Patient placed on as needed meclizine for vertigo. 5. Decreased lisinopril to 10 mg daily to prevent hypotension and hyperkalemia.  6. Patient referred to outpatient physical therapy.   Home Health: no   Equipment/Devices: no    Discharge Condition: stable  CODE STATUS: full  Diet recommendation: heart healthy   Brief/Interim Summary: Patient was admitted to the hospital with working diagnosis acute prerenal kidney injury in the setting of suspected Meniere disease.   64 year old male who presented with dizziness. He does have significant past medical history for hypertension and dyslipidemia. Patient initially seen at the emergency department 08/15/2019 for same complaint, apparently failed outpatient therapy. Continue to felt dizzy especially while standing or bending over. Positive for orthopnea,and mild lower extremity edema. Positive dyspnea on exertion. On his initial physical examination blood pressure 114/61, heart rate 73, respiratory rate 15, oxygen saturation 94%,his lungs were clear to auscultation bilaterally, heart S1-S2 present rhythmic, his abdomen was soft, positive trace lower extremity edema. Sodium 139, potassium 4.5, chloride 109, bicarb 20, glucose 195, BUN 31, creatinine 1.95, wbc 7,1,hemoglobin 14.5, hematocrit 43.2, platelets 163. COVID-19 negative. Chest radiograph negative for infiltrates. EKG 107 bpm, normal axis, normal intervals, sinus rhythm, no ST segment or T wave changes.   Patient had further work up for dyspnea on exertion with V/Q scan and  echocardiography, negative for pulmonary embolism or systolic heart failure respectively.   Symptoms with mild improvement with meclizine. His renal function improved with IV fluids.   Patient evaluated by physical therapy and will be referred to outpatient physical therapy.   1.  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  Patient presents with tinnitus and vertigo.  His symptoms had improved with meclizine and physical therapy.  Patient does have an appointment with ENT scheduled as an outpatient, likely will need further testing to confirm Mnire's disease.  At this point patient deemed stable to be discharged.  He has been referred for outpatient physical therapy.  2.  Prerenal acute kidney injury on chronic kidney disease 3a.  Patient received intravenous fluids, his kidney function improved, at discharge his creatinine is 1.5, BUN 20, sodium 138, potassium 4.5, chloride 103 and bicarb 22.  His antihypertensive agents were held during hospitalization.  3.  Chronic diastolic heart failure.  No signs of acute exacerbation, patient had further work-up with echocardiogram which showed preserved LV and RV systolic function.  Patient will continue blood pressure control with lisinopril and hydrochlorothiazide.  For exertional dyspnea patient underwent pulmonary VQ scan which was low probability for pulmonary embolism, lower extremity Doppler ultrasonography negative for deep vein thrombosis.  4.  Hypertension/dyslipidemia.  At discharge patient will resume hydrochlorothiazide, will decrease dose of lisinopril from 20 to 10 mg to prevent hypotension or hyperkalemia.  Continue atorvastatin.  5.  BPH with elevated PSA.  No signs of urinary retention, patient follows up with urology as an outpatient.  PSA was obtained per his request, resulted 11.8.  He will follow-up as an outpatient, he was told that he may need prostate biopsy.  Continue tamsulosin.  6.  Depression.  Continue duloxetine   Discharge Diagnoses:   Principal  Problem:   AKI (acute kidney injury) ([place]) Active Problems:   Neuropathy   Hypertension   Dyslipidemia   BPH with elevated PSA   Chronic diastolic CHF (congestive heart failure) ([place])   Acute kidney injury [facility])    Discharge Instructions   Allergies as of 09/20/2019   No Known Allergies     Medication List    TAKE these medications   aspirin EC 81 MG tablet Take 81 mg by mouth daily.   atorvastatin 10 MG tablet Commonly known as: LIPITOR Take 10 mg by mouth daily.   DULoxetine 30 MG capsule Commonly known as: CYMBALTA Take 30 mg by mouth 2 (two) times daily.   hydrochlorothiazide 25 MG tablet Commonly known as: HYDRODIURIL Take 25 mg by mouth daily.   lisinopril 10 MG tablet Commonly known as: ZESTRIL Take 1 tablet (10 mg total) by mouth daily. What changed:   medication strength  how much to take   meclizine 25 MG tablet Commonly known as: ANTIVERT Take 1 tablet (25 mg total) by mouth 3 (three) times daily as needed for dizziness.   tamsulosin 0.4 MG Caps capsule Commonly known as: FLOMAX Take 0.4 mg by mouth daily.       No Known Allergies     Procedures/Studies: DG Chest 1 View  Result Date: 09/18/2019 CLINICAL DATA:  Dyspnea EXAM: CHEST  1 VIEW COMPARISON:  09/17/2019 FINDINGS: Cardiac shadow is stable. Tortuous thoracic aorta is noted. The lungs are well aerated bilaterally. No focal infiltrate or sizable effusion is seen. No acute bony abnormality is noted. IMPRESSION: No acute abnormality noted. Electronically Signed   By: Inez Catalina M.D.   On: 09/18/2019 18:14   NM Pulmonary Perfusion  Result Date: 09/19/2019 CLINICAL DATA:  Low to intermediate probability. Negative D-dimer. Evaluate for PE EXAM: NUCLEAR MEDICINE PERFUSION LUNG SCAN TECHNIQUE: Perfusion images were obtained in multiple projections after intravenous injection of radiopharmaceutical. Ventilation scans intentionally deferred if perfusion scan and chest x-ray  adequate for interpretation during COVID 19 epidemic. RADIOPHARMACEUTICALS:  1.6 mCi Tc-80m MAA IV COMPARISON:  Chest radiograph 09/18/2018 FINDINGS: No peripheral or wedge-shaped segmental perfusion defects identified to suggest acute or chronic pulmonary embolus. IMPRESSION: Negative for pulmonary embolus. Electronically Signed   By: Kerby Moors M.D.   On: 09/19/2019 08:05   US RENAL  Result Date: 09/17/2019 CLINICAL DATA:  Acute kidney injury. EXAM: RENAL / URINARY TRACT ULTRASOUND COMPLETE COMPARISON:  None. FINDINGS: Right Kidney: Renal measurements: 11.0 x 5.7 x 5.9 cm = volume: 194.2 mL. The renal cortex appears thinned with increased echogenicity. No mass or hydronephrosis visualized. Left Kidney: Renal measurements: 10.4 x 5.5 x 4.6 cm = volume: 131.6 mL. There is mild cortical thinning and increased echogenicity. No mass or hydronephrosis visualized. Bladder: Appears normal for degree of bladder distention. Other: None. IMPRESSION: Negative for hydronephrosis or acute abnormality. Cortical thinning and increased echogenicity in both kidneys appear worse on the right. Electronically Signed   By: Inge Rise M.D.   On: 09/17/2019 11:39   DG Chest Portable 1 View  Result Date: 09/17/2019 CLINICAL DATA:  Shortness of breath EXAM: PORTABLE CHEST 1 VIEW COMPARISON:  08/15/2019 FINDINGS: The heart size and mediastinal contours are within normal limits. Both lungs are clear. The visualized skeletal structures are unremarkable. IMPRESSION: No active disease. Electronically Signed   By: Constance Holster M.D.   On: 09/17/2019 02:56   ECHOCARDIOGRAM COMPLETE  Result Date: 09/17/2019    ECHOCARDIOGRAM REPORT   Patient Name:   [facility]  [place] Date of Exam: 09/17/2019 Medical Rec #:  [id number]   Height:       72.0 in Accession #:    [id number]  Weight:       228.0 lb Date of Birth:  03-12-1956  BSA:          2.252 m Patient Age:    18 years    BP:           114/61 mmHg Patient Gender: M           HR:            73 bpm. Exam Location:  Inpatient Procedure: 2D Echo Indications:    786.09 dyspnea  History:        Patient has no prior history of Echocardiogram examinations.                 Risk Factors:Hypertension, Dyslipidemia and Former Smoker.  Sonographer:    Jannett Celestine RDCS (AE) Referring Phys: [id number] JENNIFER YATES  Sonographer Comments: Suboptimal apical window and no parasternal window. Image acquisition challenging due to respiratory motion and Image acquisition challenging due to patient body habitus. IMPRESSIONS  1. Left ventricular ejection fraction, by estimation, is 55 to 60%. The left ventricle has hyperdynamic function. The left ventricle has no regional wall motion abnormalities. Left ventricular diastolic parameters are consistent with Grade I diastolic dysfunction (impaired relaxation).  2. Right ventricular systolic function is normal. The right ventricular size is normal. Tricuspid regurgitation signal is inadequate for assessing PA pressure.  3. The mitral valve is grossly normal. No evidence of mitral valve regurgitation.  4. The aortic valve is grossly normal. Aortic valve regurgitation is not visualized. No aortic stenosis is present. Comparison(s): No prior Echocardiogram. FINDINGS  Left Ventricle: Left ventricular ejection fraction, by estimation, is 55 to 60%. The left ventricle has hyperdynamic function. The left ventricle has no regional wall motion abnormalities. The left ventricular internal cavity size was normal in size. There is no left ventricular hypertrophy. Left ventricular diastolic parameters are consistent with Grade I diastolic dysfunction (impaired relaxation). Normal left ventricular filling pressure. Right Ventricle: The right ventricular size is normal. No increase in right ventricular wall thickness. Right ventricular systolic function is normal. Tricuspid regurgitation signal is inadequate for assessing PA pressure. Left Atrium: Left atrial size was normal in size. Right  Atrium: Right atrial size was normal in size. Pericardium: There is no evidence of pericardial effusion. Mitral Valve: The mitral valve is grossly normal. No evidence of mitral valve regurgitation. Tricuspid Valve: The tricuspid valve is normal in structure. Tricuspid valve regurgitation is not demonstrated. Aortic Valve: The aortic valve is grossly normal. Aortic valve regurgitation is not visualized. No aortic stenosis is present. Pulmonic Valve: The pulmonic valve was not well visualized. Pulmonic valve regurgitation is not visualized. Aorta: The aortic root was not well visualized. IAS/Shunts: The interatrial septum was not assessed.   Diastology LV e' lateral:   11.00 cm/s LV E/e' lateral: 4.2 LV e' medial:    6.09 cm/s LV E/e' medial:  7.6  RIGHT VENTRICLE TAPSE (M-mode): 2.1 cm LEFT ATRIUM           Index LA Vol (A4C): 46.2 ml 20.51 ml/m  AORTIC VALVE LVOT Vmax:   83.60 cm/s LVOT Vmean:  60.600 cm/s LVOT VTI:    0.184 m MITRAL VALVE MV Area (PHT): 2.54 cm    SHUNTS MV Decel Time: 299 msec    Systemic VTI: 0.18 m MV E velocity: 46.30 cm/s MV  A velocity: 62.10 cm/s MV E/A ratio:  0.75 Mihai Croitoru MD Electronically signed by Sanda Klein MD Signature Date/Time: 09/17/2019/3:13:24 PM    Final    VAS [place] LOWER EXTREMITY VENOUS (DVT)  Result Date: 09/19/2019  Lower Venous DVTStudy Indications: Edema.  Risk Factors: None identified. Comparison Study: No prior studies. Performing Technologist: Oliver Hum RVT  Examination Guidelines: A complete evaluation includes B-mode imaging, spectral Doppler, color Doppler, and power Doppler as needed of all accessible portions of each vessel. Bilateral testing is considered an integral part of a complete examination. Limited examinations for reoccurring indications may be performed as noted. The reflux portion of the exam is performed with the patient in reverse Trendelenburg.  +---------+---------------+---------+-----------+----------+--------------+ RIGHT     CompressibilityPhasicitySpontaneityPropertiesThrombus Aging +---------+---------------+---------+-----------+----------+--------------+ CFV      Full           Yes      Yes                                 +---------+---------------+---------+-----------+----------+--------------+ SFJ      Full                                                        +---------+---------------+---------+-----------+----------+--------------+ FV Prox  Full                                                        +---------+---------------+---------+-----------+----------+--------------+ FV Mid   Full                                                        +---------+---------------+---------+-----------+----------+--------------+ FV DistalFull                                                        +---------+---------------+---------+-----------+----------+--------------+ PFV      Full                                                        +---------+---------------+---------+-----------+----------+--------------+ POP      Full           Yes      Yes                                 +---------+---------------+---------+-----------+----------+--------------+ PTV      Full                                                        +---------+---------------+---------+-----------+----------+--------------+  PERO     Full                                                        +---------+---------------+---------+-----------+----------+--------------+   +---------+---------------+---------+-----------+----------+--------------+ LEFT     CompressibilityPhasicitySpontaneityPropertiesThrombus Aging +---------+---------------+---------+-----------+----------+--------------+ CFV      Full           Yes      Yes                                 +---------+---------------+---------+-----------+----------+--------------+ SFJ      Full                                                         +---------+---------------+---------+-----------+----------+--------------+ FV Prox  Full                                                        +---------+---------------+---------+-----------+----------+--------------+ FV Mid   Full                                                        +---------+---------------+---------+-----------+----------+--------------+ FV DistalFull                                                        +---------+---------------+---------+-----------+----------+--------------+ PFV      Full                                                        +---------+---------------+---------+-----------+----------+--------------+ POP      Full           Yes      Yes                                 +---------+---------------+---------+-----------+----------+--------------+ PTV      Full                                                        +---------+---------------+---------+-----------+----------+--------------+ PERO     Full                                                        +---------+---------------+---------+-----------+----------+--------------+  Summary: RIGHT: - There is no evidence of deep vein thrombosis in the lower extremity.  - No cystic structure found in the popliteal fossa.  LEFT: - There is no evidence of deep vein thrombosis in the lower extremity.  - No cystic structure found in the popliteal fossa.  *See table(s) above for measurements and observations. Electronically signed by Monica Martinez MD on 09/19/2019 at 4:32:33 PM.    Final    CT MAXILLOFACIAL WO CONTRAST  Result Date: 09/17/2019 CLINICAL DATA:  Sinus congestion. EXAM: CT MAXILLOFACIAL WITHOUT CONTRAST TECHNIQUE: Multidetector CT imaging of the maxillofacial structures was performed. Multiplanar CT image reconstructions were also generated. COMPARISON:  August 15, 2019. FINDINGS: Osseous: No fracture or mandibular dislocation. No  destructive process. Orbits: Negative. No traumatic or inflammatory finding. Sinuses: There is no evidence of sinusitis. Soft tissues: Negative. Limited intracranial: No significant or unexpected finding. IMPRESSION: No evidence of sinusitis. Electronically Signed   By: Marijo Conception M.D.   On: 09/17/2019 11:30      Procedures:   Subjective: Patient is feeling better, vertigo has improved, no nausea or vomiting and tolerating po well. Has been ambulating.   Discharge Exam: Vitals:   09/19/19 2137 09/20/19 0359  BP: 109/69 115/80  Pulse: 93 70  Resp: 18 16  Temp: 99 F (37.2 C) 98.5 F (36.9 C)  SpO2: 95% 95%   Vitals:   09/19/19 0436 09/19/19 1343 09/19/19 2137 09/20/19 0359  BP: 115/77 131/87 109/69 115/80  Pulse: 71 80 93 70  Resp: 17 18 18 16   Temp: 97.9 F (36.6 C) 97.9 F (36.6 C) 99 F (37.2 C) 98.5 F (36.9 C)  TempSrc: Oral Oral Oral Oral  SpO2: 97% 97% 95% 95%    General: Not in pain or dyspnea  Neurology: Awake and alert, non focal  E ENT: no pallor, no icterus, oral mucosa moist Cardiovascular: No JVD. S1-S2 present, rhythmic, no gallops, rubs, or murmurs. No lower extremity edema. Pulmonary: vesicular breath sounds bilaterally, adequate air movement, no wheezing, rhonchi or rales. Gastrointestinal. Abdomen with no organomegaly, non tender, no rebound or guarding Skin. No rashes Musculoskeletal: no joint deformities   The results of significant diagnostics from this hospitalization (including imaging, microbiology, ancillary and laboratory) are listed below for reference.     Microbiology: Recent Results (from the past 240 hour(s))  SARS CORONAVIRUS 2 (TAT 6-24 HRS) Nasopharyngeal Nasopharyngeal Swab     Status: None   Collection Time: 09/17/19  4:00 AM   Specimen: Nasopharyngeal Swab  Result Value Ref Range Status   SARS Coronavirus 2 NEGATIVE NEGATIVE Final    Comment: (NOTE) SARS-CoV-2 target nucleic acids are NOT DETECTED. The SARS-CoV-2 RNA  is generally detectable in upper and lower respiratory specimens during the acute phase of infection. Negative results do not preclude SARS-CoV-2 infection, do not rule out co-infections with other pathogens, and should not be used as the sole basis for treatment or other patient management decisions. Negative results must be combined with clinical observations, patient history, and epidemiological information. The expected result is Negative. Fact Sheet for Patients: SugarRoll.be Fact Sheet for Healthcare Providers: https://www.woods-mathews.com/ This test is not yet approved or cleared by the [place] FDA and  has been authorized for detection and/or diagnosis of SARS-CoV-2 by FDA under an Emergency Use Authorization (EUA). This EUA will remain  in effect (meaning this test can be used) for the duration of the COVID-19 declaration under Section 56 4(b)(1) of the Act, 21 U.S.C. section 360bbb-3(b)(1), unless the  authorization is terminated or revoked sooner. Performed at [facility], [place] [address], [place], [place] [address]      Labs: BNP (last 3 results) Recent Labs    08/15/19 1555  BNP [id number]   Basic Metabolic Panel: Recent Labs  Lab 09/16/19 2028 09/18/19 0219 09/20/19 0354  NA 139 137 138  K 4.5 4.5 4.5  CL 109 107 103  CO2 20* 22 22  GLUCOSE 195* 87 91  BUN 31* 25* 20  CREATININE 1.95* 1.59* 1.50*  CALCIUM 9.3 9.0 9.3   Liver Function Tests: Recent Labs  Lab 09/16/19 2028  AST 21  ALT 25  ALKPHOS 59  BILITOT 0.4  PROT 6.6  ALBUMIN 3.8   No results for input(s): LIPASE, AMYLASE in the last 168 hours. No results for input(s): AMMONIA in the last 168 hours. CBC: Recent Labs  Lab 09/16/19 2028 09/18/19 0219  WBC 7.1 7.1  NEUTROABS 5.0  --   HGB 14.5 13.7  HCT 43.2 41.3  MCV 85.4 86.0  PLT 163 142*   Cardiac Enzymes: No results for input(s): CKTOTAL, CKMB, CKMBINDEX, TROPONINI in the last 168  hours. BNP: Invalid input(s): POCBNP CBG: No results for input(s): GLUCAP in the last 168 hours. D-Dimer Recent Labs    09/18/19 1505  DDIMER 0.48   Hgb A1c Recent Labs    09/17/19 1609  HGBA1C 6.1*   Lipid Profile Recent Labs    09/17/19 1612  CHOL 150  HDL 29*  LDLCALC 86  TRIG 176*  CHOLHDL 5.2   Thyroid function studies Recent Labs    09/17/19 1611  TSH 2.456   Anemia work up No results for input(s): VITAMINB12, FOLATE, FERRITIN, TIBC, IRON, RETICCTPCT in the last 72 hours. Urinalysis    Component Value Date/Time   COLORURINE YELLOW 08/15/2019 1556   APPEARANCEUR CLEAR 08/15/2019 1556   LABSPEC 1.020 08/15/2019 1556   PHURINE 5.0 08/15/2019 1556   GLUCOSEU NEGATIVE 08/15/2019 1556   HGBUR NEGATIVE 08/15/2019 1556   BILIRUBINUR NEGATIVE 08/15/2019 1556   KETONESUR NEGATIVE 08/15/2019 1556   PROTEINUR NEGATIVE 08/15/2019 1556   NITRITE NEGATIVE 08/15/2019 1556   LEUKOCYTESUR NEGATIVE 08/15/2019 1556   Sepsis Labs Invalid input(s): PROCALCITONIN,  WBC,  LACTICIDVEN Microbiology Recent Results (from the past 240 hour(s))  SARS CORONAVIRUS 2 (TAT 6-24 HRS) Nasopharyngeal Nasopharyngeal Swab     Status: None   Collection Time: 09/17/19  4:00 AM   Specimen: Nasopharyngeal Swab  Result Value Ref Range Status   SARS Coronavirus 2 NEGATIVE NEGATIVE Final    Comment: (NOTE) SARS-CoV-2 target nucleic acids are NOT DETECTED. The SARS-CoV-2 RNA is generally detectable in upper and lower respiratory specimens during the acute phase of infection. Negative results do not preclude SARS-CoV-2 infection, do not rule out co-infections with other pathogens, and should not be used as the sole basis for treatment or other patient management decisions. Negative results must be combined with clinical observations, patient history, and epidemiological information. The expected result is Negative. Fact Sheet for Patients: SugarRoll.be Fact  Sheet for Healthcare Providers: https://www.woods-mathews.com/ This test is not yet approved or cleared by the [place] FDA and  has been authorized for detection and/or diagnosis of SARS-CoV-2 by FDA under an Emergency Use Authorization (EUA). This EUA will remain  in effect (meaning this test can be used) for the duration of the COVID-19 declaration under Section 56 4(b)(1) of the Act, 21 U.S.C. section 360bbb-3(b)(1), unless the authorization is terminated or revoked sooner. Performed at [facility]  [facility], [place] [address] [place], [place] [address]      Time coordinating discharge: 45 minutes  SIGNED:   Tawni Millers, MD  [facility] 09/20/2019, 8:33 AM

## 2019-09-20 NOTE — Progress Notes (Signed)
OT Cancellation Note  Patient Details Name: Adrian Herrera MRN: [id number] DOB: [birth year]   Cancelled Treatment:    Reason Eval/Treat Not Completed: OT screened, no needs identified, will sign off. Pt independent and exiting restroom upon therapist entering room.  No OT needs identified, pt with no questions or concerns; plan to dc today.  OT will sign off.   Jolaine Artist, OT Acute Rehabilitation Services Pager [phone] Office [phone]    [name] 09/20/2019, 10:37 AM

## 2019-09-20 NOTE — Progress Notes (Signed)
Physical Therapy Treatment Patient Details Name: Adrian Herrera MRN: [id number] DOB: Mar 10, 1956 Today's Date: 09/20/2019    History of Present Illness Pt is 64 yo male who presented to ED with dizziness. PMH includes HTN and dyslipidemia.  Pt was admitted with AKI and potential Menieres disease.    PT Comments    Patient with small amount of nystagmus today in R sidelying position.  He is able to ambulated without much difficulty scored 20/24 on DGI.  His symptoms have improved from onset, but no change from yesterday.  RN reports taking Meclizine on schedule.  Feel he will benefit from PT follow up to ensure symptoms continuing to decline and for retesting for BPPV.  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  Still with tinnitus symptoms.  Pt. for d/c today.     Follow Up Recommendations  Outpatient PT(for vestibular rehab)     Equipment Recommendations  None recommended by PT    Recommendations for Other Services       Precautions / Restrictions Precautions Precautions: None    Mobility  Bed Mobility Overal bed mobility: Independent                Transfers Overall transfer level: Independent                  Ambulation/Gait Ambulation/Gait assistance: Independent Gait Distance (Feet): 150 Feet Assistive device: None Gait Pattern/deviations: Decreased stride length     General Gait Details: slower pace   Stairs Stairs: Yes Stairs assistance: Supervision Stair Management: One rail Right;Alternating pattern;Forwards Number of Stairs: 3     Wheelchair Mobility    Modified Rankin (Stroke Patients Only)       Balance                                 Standardized Balance Assessment Standardized Balance Assessment : Dynamic Gait Index   Dynamic Gait Index Level Surface: Mild Impairment Change in Gait Speed: Mild Impairment Gait with Horizontal Head Turns: Normal Gait with  Vertical Head Turns: Normal Gait and Pivot Turn: Mild Impairment Step Over Obstacle: Normal Step Around Obstacles: Normal Steps: Mild Impairment Total Score: 20      Cognition Arousal/Alertness: Awake/alert Behavior During Therapy: WFL for tasks assessed/performed Overall Cognitive Status: Within Functional Limits for tasks assessed                                        Exercises      General Comments        Pertinent Vitals/Pain Pain Assessment: No/denies pain    Home Living                      Prior Function            PT Goals (current goals can now be found in the care plan section) Progress towards PT goals: Progressing toward goals    Frequency    Min 3X/week      PT Plan Current plan remains appropriate    Co-evaluation              AM-PAC PT "6 Clicks" Mobility   Outcome Measure  Help needed turning from your back to your side while in a flat bed without using bedrails?: None Help needed moving from  lying on your back to sitting on the side of a flat bed without using bedrails?: None Help needed moving to and from a bed to a chair (including a wheelchair)?: None Help needed standing up from a chair using your arms (e.g., wheelchair or bedside chair)?: None Help needed to walk in hospital room?: None Help needed climbing 3-5 steps with a railing? : A Little 6 Click Score: 23    End of Session   Activity Tolerance: Patient tolerated treatment well Patient left: in bed;with call bell/phone within reach(seated EOB to eat lunch)   PT Visit Diagnosis: Dizziness and giddiness (R42)     Time: [id number] PT Time Calculation (min) (ACUTE ONLY): 27 min  Charges:  $Neuromuscular Re-education: 8-22 mins $Self Care/Home Management: [place], [place] [phone] 09/20/2019    Reginia Naas 09/20/2019, 1:33 PM

## 2019-09-20 NOTE — Plan of Care (Signed)

## 2019-09-20 NOTE — Final Progress Note (Signed)
PIV removed. AVS discussed with patient, all questions and concerns answered. Cab voucher given. Medical request paperwork given to patient. Note for low bed given to patient. All belongings sent with patient.

## 2019-10-09 ENCOUNTER — Emergency Department (HOSPITAL_COMMUNITY)

## 2019-10-09 ENCOUNTER — Encounter (HOSPITAL_COMMUNITY): Payer: Self-pay

## 2019-10-09 ENCOUNTER — Emergency Department (HOSPITAL_COMMUNITY)
Admission: EM | Admit: 2019-10-09 | Discharge: 2019-10-09 | Disposition: A | Discharge status: Home / Self Care | Attending: Emergency Medicine | Admitting: Emergency Medicine

## 2019-10-09 ENCOUNTER — Other Ambulatory Visit: Payer: Self-pay

## 2019-10-09 DIAGNOSIS — I10 Essential (primary) hypertension: Secondary | ICD-10-CM | POA: Insufficient documentation

## 2019-10-09 DIAGNOSIS — Z87891 Personal history of nicotine dependence: Secondary | ICD-10-CM | POA: Diagnosis not present

## 2019-10-09 DIAGNOSIS — R0602 Shortness of breath: Secondary | ICD-10-CM | POA: Insufficient documentation

## 2019-10-09 DIAGNOSIS — I951 Orthostatic hypotension: Secondary | ICD-10-CM

## 2019-10-09 DIAGNOSIS — Z20822 Contact with and (suspected) exposure to covid-19: Secondary | ICD-10-CM | POA: Insufficient documentation

## 2019-10-09 DIAGNOSIS — Z7982 Long term (current) use of aspirin: Secondary | ICD-10-CM | POA: Diagnosis not present

## 2019-10-09 DIAGNOSIS — Z8546 Personal history of malignant neoplasm of prostate: Secondary | ICD-10-CM | POA: Insufficient documentation

## 2019-10-09 DIAGNOSIS — R0682 Tachypnea, not elsewhere classified: Secondary | ICD-10-CM | POA: Diagnosis not present

## 2019-10-09 LAB — URINALYSIS, ROUTINE W REFLEX MICROSCOPIC
Bilirubin Urine: NEGATIVE
Glucose, UA: NEGATIVE mg/dL
Hgb urine dipstick: NEGATIVE
Ketones, ur: NEGATIVE mg/dL
Leukocytes,Ua: NEGATIVE
Nitrite: NEGATIVE
Protein, ur: NEGATIVE mg/dL
Specific Gravity, Urine: 1.014 (ref 1.005–1.030)
pH: 6 (ref 5.0–8.0)

## 2019-10-09 LAB — CBC WITH DIFFERENTIAL/PLATELET
Abs Immature Granulocytes: 0.04 10*3/uL (ref 0.00–0.07)
Basophils Absolute: 0 10*3/uL (ref 0.0–0.1)
Basophils Relative: 0 %
Eosinophils Absolute: 0.3 10*3/uL (ref 0.0–0.5)
Eosinophils Relative: 3 %
HCT: 44.7 % (ref 39.0–52.0)
Hemoglobin: 14.8 g/dL (ref 13.0–17.0)
Immature Granulocytes: 0 %
Lymphocytes Relative: 9 %
Lymphs Abs: 0.9 10*3/uL (ref 0.7–4.0)
MCH: 28.7 pg (ref 26.0–34.0)
MCHC: 33.1 g/dL (ref 30.0–36.0)
MCV: 86.8 fL (ref 80.0–100.0)
Monocytes Absolute: 0.7 10*3/uL (ref 0.1–1.0)
Monocytes Relative: 7 %
Neutro Abs: 8.6 10*3/uL — ABNORMAL HIGH (ref 1.7–7.7)
Neutrophils Relative %: 81 %
Platelets: 152 10*3/uL (ref 150–400)
RBC: 5.15 MIL/uL (ref 4.22–5.81)
RDW: 12.8 % (ref 11.5–15.5)
WBC: 10.6 10*3/uL — ABNORMAL HIGH (ref 4.0–10.5)
nRBC: 0 % (ref 0.0–0.2)

## 2019-10-09 LAB — BASIC METABOLIC PANEL
Anion gap: 12 (ref 5–15)
BUN: 28 mg/dL — ABNORMAL HIGH (ref 8–23)
CO2: 20 mmol/L — ABNORMAL LOW (ref 22–32)
Calcium: 9.4 mg/dL (ref 8.9–10.3)
Chloride: 103 mmol/L (ref 98–111)
Creatinine, Ser: 1.9 mg/dL — ABNORMAL HIGH (ref 0.61–1.24)
GFR calc Af Amer: 43 mL/min — ABNORMAL LOW (ref >60)
GFR calc non Af Amer: 37 mL/min — ABNORMAL LOW (ref >60)
Glucose, Bld: 192 mg/dL — ABNORMAL HIGH (ref 70–99)
Potassium: 4.7 mmol/L (ref 3.5–5.1)
Sodium: 135 mmol/L (ref 135–145)

## 2019-10-09 LAB — TROPONIN I (HIGH SENSITIVITY)
Troponin I (High Sensitivity): 2 ng/L (ref <18)
Troponin I (High Sensitivity): 3 ng/L (ref <18)

## 2019-10-09 LAB — POC SARS CORONAVIRUS 2 AG -  ED: SARS Coronavirus 2 Ag: NEGATIVE

## 2019-10-09 LAB — HEPATIC FUNCTION PANEL
ALT: 25 U/L (ref 0–44)
AST: 21 U/L (ref 15–41)
Albumin: 3.8 g/dL (ref 3.5–5.0)
Alkaline Phosphatase: 60 U/L (ref 38–126)
Bilirubin, Direct: 0.2 mg/dL (ref 0.0–0.2)
Indirect Bilirubin: 0.8 mg/dL (ref 0.3–0.9)
Total Bilirubin: 1 mg/dL (ref 0.3–1.2)
Total Protein: 6.9 g/dL (ref 6.5–8.1)

## 2019-10-09 LAB — LIPASE, BLOOD: Lipase: 38 U/L (ref 11–51)

## 2019-10-09 LAB — CK: Total CK: 83 U/L (ref 49–397)

## 2019-10-09 LAB — D-DIMER, QUANTITATIVE: D-Dimer, Quant: 0.34 ug/mL-FEU (ref 0.00–0.50)

## 2019-10-09 MED ORDER — DULOXETINE HCL 30 MG PO CPEP
30.0000 mg | ORAL_CAPSULE | Freq: Once | ORAL | Status: AC
  Administered 2019-10-09: 30 mg via ORAL
  Filled 2019-10-09: qty 1

## 2019-10-09 MED ORDER — SODIUM CHLORIDE 0.9 % IV BOLUS
1000.0000 mL | Freq: Once | INTRAVENOUS | Status: AC
  Administered 2019-10-09: 15:00:00 1000 mL via INTRAVENOUS

## 2019-10-09 MED ORDER — SODIUM CHLORIDE 0.9 % IV BOLUS
1000.0000 mL | Freq: Once | INTRAVENOUS | Status: AC
  Administered 2019-10-09: 18:00:00 1000 mL via INTRAVENOUS

## 2019-10-09 MED ORDER — LORAZEPAM 2 MG/ML IJ SOLN
1.0000 mg | Freq: Once | INTRAMUSCULAR | Status: AC
  Administered 2019-10-09: 1 mg via INTRAVENOUS
  Filled 2019-10-09: qty 1

## 2019-10-09 NOTE — ED Provider Notes (Signed)
Patient care assumed at 1500.  Pt here for episode of sudden onset sob, near syncopal episode.  EMS reported hyperventilation.  Pt anxious on ED arrival, resolved after ativan administration.  Plan to recheck after IVF bolus, trop and UA.    Labs with mild elevation in creatinine compared to his recent hospital discharge. Patient has no recurrent shortness of breath during his ED observation. He did have some hypotension, that resolved after IV fluid hydration. He recently had a negative PQ scan. Current presentation is not consistent with PE, acute CHF, ACS, sepsis. An ultrasound was obtained to rule out AAA contribute to his hypotension. Discussed with patient recommendation to discontinue his lisinopril at this time as it may be contributing to his orthostasis. Discussed importance of outpatient follow-up and return precautions.   Quintella Reichert, MD 10/09/19 2318

## 2019-10-09 NOTE — ED Triage Notes (Signed)
Transported to ultrasound

## 2019-10-09 NOTE — ED Triage Notes (Addendum)
Per [facility]: Pt was at home, roommate said pt grabbed his chest and passed out. Staff there arrived and pt was "regaining consciousness". When EMS arrived pt was breathing quickly, anxious. Initial oxygen saturation was in the 70's on room air, breathing about 40 times a minute. Improved up to the upper 80's on high flow NRB. Heart rate was 128. 12 lead unremarkable.  BP was 110/70 with EMS, CBG 88.  Report just obtained from [facility]

## 2019-10-09 NOTE — Discharge Instructions (Addendum)
Her blood pressure was a little bit low today. Please stop taking your lisinopril. Be sure to be drinking plenty of fluids. Please follow-up with your family doctor for recheck. Get rechecked sooner if you develop any new or concerning symptoms.

## 2019-10-09 NOTE — ED Provider Notes (Signed)
[facility] EMERGENCY DEPARTMENT Provider Note   CSN: [id number] Arrival date & time: 10/09/19  1359     History Chief Complaint  Patient presents with  . Shortness of Breath    Adrian Herrera is a 64 y.o. male.  Patient with history of hypertension, neuropathy, possibly Mnire's disease, history of prostate cancer who presents to the ED with shortness of breath.  Patient was feeling well then he had sudden feeling of shortness of breath, feel like he was going to pass out.  EMS states that he was hyperventilating upon arrival with them.  He might of lost consciousness but EMS states that he was awake and hyperventilating with them.  Patient denies chest pain, abdominal pain.  He feels like he is having leg spasms.  Denies any headache, nausea, vomiting.  The history is provided by the patient and the EMS personnel.  Shortness of Breath Severity:  Moderate Onset quality:  Sudden Timing:  Constant Progression:  Unchanged Chronicity:  New Relieved by:  Nothing Worsened by:  Nothing Associated symptoms: no abdominal pain, no chest pain, no claudication, no cough, no diaphoresis, no ear pain, no fever, no headaches, no hemoptysis, no neck pain, no PND, no rash, no sore throat, no sputum production, no syncope, no swollen glands, no vomiting and no wheezing        Past Medical History:  Diagnosis Date  . BPH with elevated PSA   . Complication of anesthesia    " i HAD A COMPLICATION AT [facility] , BUT I RECALL WHAT IT WAS "  . Dyslipidemia   . GERD (gastroesophageal reflux disease)   . Hypertension   . Neuropathy   . Tinnitus     Patient Active Problem List   Diagnosis Date Noted  . Acute kidney injury ([place]) 09/18/2019  . AKI (acute kidney injury) ([place]) 09/17/2019  . Chronic diastolic CHF (congestive heart failure) ([place]) 09/17/2019  . Neuropathy   . Hypertension   . Dyslipidemia   . BPH with elevated PSA     Past Surgical History:  Procedure Laterality  Date  . FACIAL FRACTURE SURGERY     "I run into a crow bar about 4 times"       Family History  Problem Relation Age of Onset  . CAD Mother   . Parkinson's disease Father   . Dementia Father     Social History   Tobacco Use  . Smoking status: Former Smoker    Packs/day: 2.00    Years: 15.00    Pack years: 30.00  . Smokeless tobacco: Never Used  Substance Use Topics  . Alcohol use: Not Currently  . Drug use: Not Currently    Types: Marijuana    Home Medications Prior to Admission medications   Medication Sig Start Date End Date Taking? Authorizing Provider  aspirin EC 81 MG tablet Take 81 mg by mouth daily.   Yes [provider]  atorvastatin (LIPITOR) 10 MG tablet Take 10 mg by mouth daily.   Yes [provider]  DULoxetine (CYMBALTA) 30 MG capsule Take 30 mg by mouth 2 (two) times daily.   Yes [provider]  fluticasone (FLONASE) 50 MCG/ACT nasal spray Place 1 spray into both nostrils daily.   Yes [provider]  hydrochlorothiazide (HYDRODIURIL) 25 MG tablet Take 25 mg by mouth daily.   Yes [provider]  lisinopril (ZESTRIL) 10 MG tablet Take 1 tablet (10 mg total) by mouth daily. Patient taking differently: Take 5  mg by mouth daily.  09/20/19 10/20/19 Yes Arrien, Jimmy Picket, MD  meclizine (ANTIVERT) 25 MG tablet Take 1 tablet (25 mg total) by mouth 3 (three) times daily as needed for dizziness. 09/20/19  Yes Arrien, Jimmy Picket, MD  tamsulosin (FLOMAX) 0.4 MG CAPS capsule Take 0.4 mg by mouth daily.   Yes [provider]  doxazosin (CARDURA) 2 MG tablet Take 2 mg by mouth 2 (two) times daily. 10/08/19   [provider]    Allergies    Patient has no known allergies.  Review of Systems   Review of Systems  Constitutional: Negative for chills, diaphoresis and fever.  HENT: Negative for ear pain and sore throat.   Eyes: Negative for pain and visual disturbance.  Respiratory: Positive for  shortness of breath. Negative for cough, hemoptysis, sputum production and wheezing.   Cardiovascular: Negative for chest pain, palpitations, claudication, syncope and PND.  Gastrointestinal: Negative for abdominal pain and vomiting.  Genitourinary: Negative for dysuria and hematuria.  Musculoskeletal: Negative for arthralgias, back pain, gait problem, myalgias, neck pain and neck stiffness.       Leg spasms  Skin: Negative for color change and rash.  Neurological: Negative for dizziness, tremors, seizures, syncope, facial asymmetry, speech difficulty, weakness, light-headedness, numbness and headaches.  All other systems reviewed and are negative.   Physical Exam Updated Vital Signs  ED Triage Vitals  Enc Vitals Group     BP 10/09/19 1418 96/71     Pulse Rate 10/09/19 1418 91     Resp 10/09/19 1418 (!) 25     Temp 10/09/19 1418 98 F (36.7 C)     Temp Source 10/09/19 1418 Oral     SpO2 10/09/19 1418 100 %     Weight --      Height --      Head Circumference --      Peak Flow --      Pain Score 10/09/19 1444 6     Pain Loc --      Pain Edu? --      Excl. in [place]? --     Physical Exam Vitals and nursing note reviewed.  Constitutional:      General: He is in acute distress.     Appearance: He is well-developed.  HENT:     Head: Normocephalic and atraumatic.     Mouth/Throat:     Mouth: Mucous membranes are moist.  Eyes:     Conjunctiva/sclera: Conjunctivae normal.     Pupils: Pupils are equal, round, and reactive to light.  Cardiovascular:     Rate and Rhythm: Normal rate and regular rhythm.     Pulses: Normal pulses.     Heart sounds: Normal heart sounds. No murmur.  Pulmonary:     Effort: Pulmonary effort is normal. Tachypnea present. No respiratory distress.     Breath sounds: Normal breath sounds. No decreased breath sounds, wheezing, rhonchi or rales.  Abdominal:     Palpations: Abdomen is soft.     Tenderness: There is no abdominal tenderness.    Musculoskeletal:        General: Normal range of motion.     Cervical back: Normal range of motion and neck supple.     Right lower leg: No edema.     Left lower leg: No edema.  Skin:    General: Skin is warm and dry.     Capillary Refill: Capillary refill takes less than 2 seconds.  Neurological:  General: No focal deficit present.     Mental Status: He is alert and oriented to person, place, and time.     Cranial Nerves: No cranial nerve deficit.     Motor: No weakness.     Comments: 5+ out of 5 strength throughout, normal sensation, no drift, normal finger-to-nose, normal speech, appears to have spasm in his lower extremities  Psychiatric:        Mood and Affect: Mood is anxious.     ED Results / Procedures / Treatments   Labs (all labs ordered are listed, but only abnormal results are displayed) Labs Reviewed  CBC WITH DIFFERENTIAL/PLATELET - Abnormal; Notable for the following components:      Result Value   WBC 10.6 (*)    Neutro Abs 8.6 (*)    All other components within normal limits  BASIC METABOLIC PANEL - Abnormal; Notable for the following components:   CO2 20 (*)    Glucose, Bld 192 (*)    BUN 28 (*)    Creatinine, Ser 1.90 (*)    GFR calc non Af Amer 37 (*)    GFR calc Af Amer 43 (*)    All other components within normal limits  HEPATIC FUNCTION PANEL  LIPASE, BLOOD  D-DIMER, QUANTITATIVE (NOT AT [facility])  CK  URINALYSIS, ROUTINE W REFLEX MICROSCOPIC  POC SARS CORONAVIRUS 2 AG -  ED  TROPONIN I (HIGH SENSITIVITY)  TROPONIN I (HIGH SENSITIVITY)    EKG EKG Interpretation  Date/Time:  Wednesday October 09 2019 14:02:03 EDT Ventricular Rate:  101 PR Interval:    QRS Duration: 105 QT Interval:  333 QTC Calculation: 432 R Axis:   -48 Text Interpretation: Sinus tachycardia Left axis deviation Probable anteroseptal infarct, old Confirmed by Adrian Herrera [phone]) on 10/09/2019 2:58:42 PM   Radiology DG Chest Portable 1 View  Result Date:  10/09/2019 CLINICAL DATA:  Syncopal episode today. Nausea, dizziness and left-sided chest pain. EXAM: PORTABLE CHEST 1 VIEW COMPARISON:  Radiographs 09/18/2019 and 09/17/2019. FINDINGS: 1434 hours. The heart size and mediastinal contours are stable. The lungs are hyperinflated with mild biapical scarring. There is no edema, confluent airspace opacity, pleural effusion or pneumothorax. The bones appear unremarkable. Telemetry leads overlie the chest. IMPRESSION: Stable chest with evidence of emphysema. No acute cardiopulmonary process. Electronically Signed   By: Richardean Sale M.D.   On: 10/09/2019 14:44    Procedures Procedures (including critical care time)  Medications Ordered in ED Medications  sodium chloride 0.9 % bolus 1,000 mL (has no administration in time range)  sodium chloride 0.9 % bolus 1,000 mL (1,000 mLs Intravenous New Bag/Given 10/09/19 1433)  LORazepam (ATIVAN) injection 1 mg (1 mg Intravenous Given 10/09/19 1433)    ED Course  I have reviewed the triage vital signs and the nursing notes.  Pertinent labs & imaging results that were available during my care of the patient were reviewed by me and considered in my medical decision making (see chart for details).    MDM Rules/Calculators/A&P                      Takao Parkman is a 64 year old male with history of neuropathy, prostate cancer, dizziness, Mnire's, hypertension who presents to the ED with shortness of breath.  Patient was at home at a halfway house when he suddenly felt short of breath, felt like he was in a pass out.  EMS states that when they arrived patient was breathing quickly was anxious and hyperventilating.  Was difficult to get a oxygen saturation but they put him on a nonrebreather.  Patient upon arrival appears to be tachypneic.  He has spasms in his lower extremities.  However he has clear breath sounds, no signs of volume overload.  Denies any chest pain.  Normal neurological exam.  EKG shows sinus  rhythm.  No ischemic changes.  No PE risk factors.  Does have COPD but no wheezing on exam.  Will evaluate with troponins, D-dimer, basic labs.  Patient states that he feels dehydrated.  Had recent admission for dehydration he states.  Denies any alcohol or drug use.  Patient with normal troponin.  D-dimer normal.  Doubt PE.  Chest x-ray without any signs of infection, no pneumothorax.  No significant anemia, electrolyte abnormality.  Creatinine slightly above baseline at 1.9.  Otherwise lab work is unremarkable.  Patient was given normal saline bolus, IV Ativan.  On reevaluation patient is much more calm.  States that his symptoms have improved.  No longer has spasms.  States that he is having some ringing in his ear but he has been dealing with that.  Patient was recently admitted for an AKI.  Blood pressure medications were slightly changed but he continues on a hydrochlorothiazide medication and a reduced dose of lisinopril.  Blood pressure on reevaluation is soft in the low 90s.  Had one episode of blood pressure in the high 80s.  Overall may be some mild dehydration.  Will give additional fluid bolus.  Will check repeat troponin, urinalysis.  No concern for significant cardiac event.  Seems less likely to be ACS as no chest pain.  D-dimer was negative and doubt PE.  History and physical is not consistent with dissection.  Normal pulses throughout.  No active chest pain.  Overall suspect possibly panic attack with related volume depletion.  No concern for sepsis.  No fever.  Patient was signed out to oncoming ED staff with patient pending reevaluation after second liter bolus, urinalysis, repeat troponin.  Please see her note for further results, evaluation, disposition of the patient.  This chart was dictated using voice recognition software.  Despite best efforts to proofread,  errors can occur which can change the documentation meaning.     Final Clinical Impression(s) / ED Diagnoses Final  diagnoses:  SOB (shortness of breath)    Rx / DC Orders ED Discharge Orders    None       Adrian Sites, DO 10/09/19 1620

## 2019-10-11 ENCOUNTER — Other Ambulatory Visit: Payer: Self-pay

## 2019-10-11 ENCOUNTER — Emergency Department (HOSPITAL_COMMUNITY)

## 2019-10-11 ENCOUNTER — Encounter (HOSPITAL_COMMUNITY): Payer: Self-pay | Admitting: Emergency Medicine

## 2019-10-11 ENCOUNTER — Emergency Department (HOSPITAL_COMMUNITY)
Admission: EM | Admit: 2019-10-11 | Discharge: 2019-10-11 | Disposition: A | Discharge status: Home / Self Care | Attending: Emergency Medicine | Admitting: Emergency Medicine

## 2019-10-11 DIAGNOSIS — Z7982 Long term (current) use of aspirin: Secondary | ICD-10-CM | POA: Insufficient documentation

## 2019-10-11 DIAGNOSIS — R0602 Shortness of breath: Secondary | ICD-10-CM | POA: Diagnosis not present

## 2019-10-11 DIAGNOSIS — R062 Wheezing: Secondary | ICD-10-CM | POA: Diagnosis not present

## 2019-10-11 DIAGNOSIS — Z79899 Other long term (current) drug therapy: Secondary | ICD-10-CM | POA: Insufficient documentation

## 2019-10-11 DIAGNOSIS — J019 Acute sinusitis, unspecified: Secondary | ICD-10-CM | POA: Diagnosis not present

## 2019-10-11 DIAGNOSIS — I110 Hypertensive heart disease with heart failure: Secondary | ICD-10-CM | POA: Insufficient documentation

## 2019-10-11 DIAGNOSIS — R42 Dizziness and giddiness: Secondary | ICD-10-CM | POA: Diagnosis not present

## 2019-10-11 DIAGNOSIS — Z87891 Personal history of nicotine dependence: Secondary | ICD-10-CM | POA: Diagnosis not present

## 2019-10-11 DIAGNOSIS — I5032 Chronic diastolic (congestive) heart failure: Secondary | ICD-10-CM | POA: Insufficient documentation

## 2019-10-11 LAB — TROPONIN I (HIGH SENSITIVITY)
Troponin I (High Sensitivity): 2 ng/L (ref <18)
Troponin I (High Sensitivity): <2 ng/L (ref <18)

## 2019-10-11 LAB — CBC WITH DIFFERENTIAL/PLATELET
Abs Immature Granulocytes: 0.02 10*3/uL (ref 0.00–0.07)
Basophils Absolute: 0 10*3/uL (ref 0.0–0.1)
Basophils Relative: 0 %
Eosinophils Absolute: 0.2 10*3/uL (ref 0.0–0.5)
Eosinophils Relative: 3 %
HCT: 43.9 % (ref 39.0–52.0)
Hemoglobin: 14.5 g/dL (ref 13.0–17.0)
Immature Granulocytes: 0 %
Lymphocytes Relative: 10 %
Lymphs Abs: 0.9 10*3/uL (ref 0.7–4.0)
MCH: 28.5 pg (ref 26.0–34.0)
MCHC: 33 g/dL (ref 30.0–36.0)
MCV: 86.4 fL (ref 80.0–100.0)
Monocytes Absolute: 0.8 10*3/uL (ref 0.1–1.0)
Monocytes Relative: 9 %
Neutro Abs: 6.7 10*3/uL (ref 1.7–7.7)
Neutrophils Relative %: 78 %
Platelets: 143 10*3/uL — ABNORMAL LOW (ref 150–400)
RBC: 5.08 MIL/uL (ref 4.22–5.81)
RDW: 12.7 % (ref 11.5–15.5)
WBC: 8.6 10*3/uL (ref 4.0–10.5)
nRBC: 0 % (ref 0.0–0.2)

## 2019-10-11 LAB — BASIC METABOLIC PANEL
Anion gap: 12 (ref 5–15)
BUN: 23 mg/dL (ref 8–23)
CO2: 23 mmol/L (ref 22–32)
Calcium: 9.6 mg/dL (ref 8.9–10.3)
Chloride: 104 mmol/L (ref 98–111)
Creatinine, Ser: 2.11 mg/dL — ABNORMAL HIGH (ref 0.61–1.24)
GFR calc Af Amer: 38 mL/min — ABNORMAL LOW (ref >60)
GFR calc non Af Amer: 33 mL/min — ABNORMAL LOW (ref >60)
Glucose, Bld: 112 mg/dL — ABNORMAL HIGH (ref 70–99)
Potassium: 4.5 mmol/L (ref 3.5–5.1)
Sodium: 139 mmol/L (ref 135–145)

## 2019-10-11 MED ORDER — AMOXICILLIN-POT CLAVULANATE 875-125 MG PO TABS: 1.0000 | ORAL_TABLET | Freq: Two times a day (BID) | ORAL | 0 refills | Status: DC

## 2019-10-11 MED ORDER — PREDNISONE 20 MG PO TABS
60.0000 mg | ORAL_TABLET | Freq: Once | ORAL | Status: AC
  Administered 2019-10-11: 60 mg via ORAL
  Filled 2019-10-11: qty 3

## 2019-10-11 MED ORDER — PREDNISONE 10 MG PO TABS: 40.0000 mg | ORAL_TABLET | Freq: Every day | ORAL | 0 refills | Status: AC

## 2019-10-11 MED ORDER — PREDNISONE 10 MG PO TABS: 40.0000 mg | ORAL_TABLET | Freq: Every day | ORAL | 0 refills | Status: DC

## 2019-10-11 MED ORDER — MECLIZINE HCL 25 MG PO TABS
50.0000 mg | ORAL_TABLET | Freq: Once | ORAL | Status: AC
  Administered 2019-10-11: 50 mg via ORAL
  Filled 2019-10-11: qty 2

## 2019-10-11 MED ORDER — SODIUM CHLORIDE 0.9 % IV BOLUS (SEPSIS)
1000.0000 mL | Freq: Once | INTRAVENOUS | Status: AC
  Administered 2019-10-11: 1000 mL via INTRAVENOUS

## 2019-10-11 MED ORDER — ALBUTEROL SULFATE HFA 108 (90 BASE) MCG/ACT IN AERS: 2.0000 | INHALATION_SPRAY | RESPIRATORY_TRACT | 0 refills | Status: DC | PRN

## 2019-10-11 MED ORDER — FLUTICASONE PROPIONATE 50 MCG/ACT NA SUSP: 2.0000 | Freq: Every day | NASAL | 2 refills | Status: DC

## 2019-10-11 MED ORDER — LORAZEPAM 2 MG/ML IJ SOLN
1.0000 mg | Freq: Once | INTRAMUSCULAR | Status: AC
  Administered 2019-10-11: 16:00:00 1 mg via INTRAVENOUS
  Filled 2019-10-11: qty 1

## 2019-10-11 MED ORDER — ALBUTEROL SULFATE (2.5 MG/3ML) 0.083% IN NEBU: 5.0000 mg | INHALATION_SOLUTION | Freq: Once | RESPIRATORY_TRACT | Status: DC

## 2019-10-11 MED ORDER — ALBUTEROL SULFATE HFA 108 (90 BASE) MCG/ACT IN AERS
2.0000 | INHALATION_SPRAY | Freq: Once | RESPIRATORY_TRACT | Status: AC
  Administered 2019-10-11: 2 via RESPIRATORY_TRACT
  Filled 2019-10-11: qty 6.7

## 2019-10-11 NOTE — ED Notes (Signed)
Pt verbalized understanding of discharge instructions. Prescriptions reviewed w/ pt and halfway house manager. Pt had no further questions.

## 2019-10-11 NOTE — ED Notes (Signed)
RN completed orthostatic vital signs, pt reports dizziness and lightheadedness from laying to sitting, and sitting to standing. Pt's HR increased into 130s upon standing.

## 2019-10-11 NOTE — ED Triage Notes (Signed)
Pt arrives via ems from [address] states feels sob due to sinus infection making his nose feel closed, states also felt a little dizzy from ears, was seen 2 days ago for same cbg 141 has 20 left ac

## 2019-10-11 NOTE — ED Provider Notes (Signed)
[facility] EMERGENCY DEPARTMENT Provider Note   CSN: [id number] Arrival date & time: 10/11/19  1413     History Chief Complaint  Patient presents with  . Shortness of Breath    Adrian Herrera is a 64 y.o. male.  Patient is a 64 year old gentleman coming from [facility] with a past medical history of hypertension, BPH with elevated PSA, GERD, neuropathy, Mnire's disease presenting to the emergency department for shortness of breath, nasal congestion, dizziness and tinnitus with headache.  Patient reports that the symptoms have been off and on since January.  He has been seen in the emergency department multiple times for the same.  He does report that he has a scheduled ear nose and throat follow-up as well as urology follow-up sometime next month.  He reports that his nasal congestion seems to be getting worse and he has some purulent drainage from his nostrils.  Reports that he has a frontal headache and tinnitus that comes and goes.  He felt like his symptoms were getting worse and that he has a sinus infection so he came to the emergency department.  He also endorsed occasional shortness of breath.  He denies any chest pain, fever, chills, coughing.  He was most recently seen here in this emergency his room just 2 days ago.  His lisinopril was discontinued he says at that time because his blood pressure was low.  He was thought to have some mild dehydration in the context of anxiety and was improved with Ativan.  Patient also had a CT scan in February of this year and was normal.        Past Medical History:  Diagnosis Date  . BPH with elevated PSA   . Complication of anesthesia    " i HAD A COMPLICATION AT [facility] , BUT I RECALL WHAT IT WAS "  . Dyslipidemia   . GERD (gastroesophageal reflux disease)   . Hypertension   . Neuropathy   . Tinnitus     Patient Active Problem List   Diagnosis Date Noted  . Acute kidney injury ([place]) 09/18/2019  . AKI (acute  kidney injury) ([place]) 09/17/2019  . Chronic diastolic CHF (congestive heart failure) ([place]) 09/17/2019  . Neuropathy   . Hypertension   . Dyslipidemia   . BPH with elevated PSA     Past Surgical History:  Procedure Laterality Date  . FACIAL FRACTURE SURGERY     "I run into a crow bar about 4 times"       Family History  Problem Relation Age of Onset  . CAD Mother   . Parkinson's disease Father   . Dementia Father     Social History   Tobacco Use  . Smoking status: Former Smoker    Packs/day: 2.00    Years: 15.00    Pack years: 30.00  . Smokeless tobacco: Never Used  Substance Use Topics  . Alcohol use: Not Currently  . Drug use: Not Currently    Types: Marijuana    Home Medications Prior to Admission medications   Medication Sig Start Date End Date Taking? Authorizing Provider  aspirin EC 81 MG tablet Take 81 mg by mouth daily.   Yes [provider]  atorvastatin (LIPITOR) 10 MG tablet Take 10 mg by mouth every evening.    Yes [provider]  doxazosin (CARDURA) 2 MG tablet Take 2 mg by mouth 2 (two) times daily. 10/08/19  Yes [provider]  DULoxetine (CYMBALTA) 30 MG capsule  Take 30 mg by mouth 2 (two) times daily.   Yes [provider]  fluticasone (FLONASE) 50 MCG/ACT nasal spray Place 1 spray into both nostrils daily as needed for allergies or rhinitis.   Yes [provider]  hydrochlorothiazide (HYDRODIURIL) 25 MG tablet Take 25 mg by mouth daily.   Yes [provider]  meclizine (ANTIVERT) 25 MG tablet Take 1 tablet (25 mg total) by mouth 3 (three) times daily as needed for dizziness. 09/20/19  Yes Arrien, Jimmy Picket, MD  albuterol (VENTOLIN HFA) 108 (90 Base) MCG/ACT inhaler Inhale 2 puffs into the lungs every 4 (four) hours as needed for wheezing or shortness of breath. 10/11/19 11/10/19  Madilyn Hook A, PA-C  amoxicillin-clavulanate (AUGMENTIN) 875-125 MG tablet Take 1 tablet by mouth every 12 (twelve)  hours. 10/11/19   Alveria Apley, PA-C  fluticasone (FLONASE) 50 MCG/ACT nasal spray Place 2 sprays into both nostrils daily for 7 days. 10/11/19 10/18/19  Alveria Apley, PA-C  predniSONE (DELTASONE) 10 MG tablet Take 4 tablets (40 mg total) by mouth daily for 5 days. 10/11/19 10/16/19  Madilyn Hook A, PA-C  lisinopril (ZESTRIL) 10 MG tablet Take 1 tablet (10 mg total) by mouth daily. Patient taking differently: Take 5 mg by mouth daily.  09/20/19 10/09/19  Arrien, Jimmy Picket, MD    Allergies    Patient has no known allergies.  Review of Systems   Review of Systems  Constitutional: Negative for chills, fatigue and fever.  HENT: Positive for congestion, postnasal drip, rhinorrhea, sinus pressure, sinus pain and tinnitus. Negative for dental problem, ear discharge, ear pain, mouth sores and sore throat.   Eyes: Negative for pain and visual disturbance.  Respiratory: Positive for shortness of breath. Negative for cough and chest tightness.   Cardiovascular: Negative for chest pain and palpitations.  Gastrointestinal: Negative for abdominal pain, nausea and vomiting.  Genitourinary: Negative for dysuria and hematuria.  Musculoskeletal: Negative for arthralgias and back pain.  Skin: Negative for color change and rash.  Neurological: Positive for dizziness and headaches. Negative for seizures, syncope and light-headedness.  All other systems reviewed and are negative.   Physical Exam Updated Vital Signs BP 130/89   Pulse 97   Temp 98.2 F (36.8 C) (Oral)   Resp (!) 23   Ht 5\' 11"  (1.803 m)   Wt 107.5 kg   SpO2 94%   BMI 33.05 kg/m   Physical Exam Vitals and nursing note reviewed.  Constitutional:      General: He is not in acute distress.    Appearance: Normal appearance. He is well-developed. He is obese. He is not ill-appearing, toxic-appearing or diaphoretic.  HENT:     Head: Normocephalic.     Right Ear: Tympanic membrane normal.     Left Ear: Tympanic membrane normal.      Nose:     Right Nostril: No epistaxis.     Left Nostril: No epistaxis.     Mouth/Throat:     Mouth: Mucous membranes are moist.     Pharynx: Oropharynx is clear. Uvula midline.  Eyes:     Extraocular Movements: Extraocular movements intact.     Conjunctiva/sclera: Conjunctivae normal.     Pupils: Pupils are equal, round, and reactive to light.  Cardiovascular:     Rate and Rhythm: Normal rate and regular rhythm.  Pulmonary:     Effort: Pulmonary effort is normal.     Breath sounds: Examination of the right-middle field reveals wheezing. Examination of the left-middle  field reveals wheezing. Examination of the right-lower field reveals wheezing. Examination of the left-lower field reveals wheezing. Wheezing present.  Chest:     Chest wall: No mass or deformity.  Musculoskeletal:     Cervical back: Normal range of motion and neck supple.     Right lower leg: No edema.     Left lower leg: No edema.  Skin:    General: Skin is dry.  Neurological:     Mental Status: He is alert.  Psychiatric:        Mood and Affect: Mood normal.     ED Results / Procedures / Treatments   Labs (all labs ordered are listed, but only abnormal results are displayed) Labs Reviewed  CBC WITH DIFFERENTIAL/PLATELET - Abnormal; Notable for the following components:      Result Value   Platelets 143 (*)    All other components within normal limits  BASIC METABOLIC PANEL - Abnormal; Notable for the following components:   Glucose, Bld 112 (*)    Creatinine, Ser 2.11 (*)    GFR calc non Af Amer 33 (*)    GFR calc Af Amer 38 (*)    All other components within normal limits  TROPONIN I (HIGH SENSITIVITY)  TROPONIN I (HIGH SENSITIVITY)    EKG None  Radiology DG Chest 2 View  Result Date: 10/11/2019 CLINICAL DATA:  64 year old male with a history of shortness of breath EXAM: CHEST - 2 VIEW COMPARISON:  10/09/2019 FINDINGS: Cardiomediastinal silhouette unchanged in size and contour. No pneumothorax. No  pleural effusion. No confluent airspace disease. Stigmata of emphysema, with increased retrosternal airspace, flattened hemidiaphragms, increased AP diameter, and hyperinflation on the AP view. Degenerative changes of the spine.  No displaced fracture. IMPRESSION: Chronic changes and emphysema without evidence of acute cardiopulmonary disease Electronically Signed   By: Corrie Mckusick D.O.   On: 10/11/2019 14:47   US Aorta  Result Date: 10/09/2019 CLINICAL DATA:  Hypotension EXAM: ULTRASOUND OF ABDOMINAL AORTA TECHNIQUE: Ultrasound examination of the abdominal aorta and proximal common iliac arteries was performed to evaluate for aneurysm. Additional color and Doppler images of the distal aorta were obtained to document patency. COMPARISON:  None. FINDINGS: Abdominal aortic measurements as follows: Proximal:  2.9 x 2.9 cm Mid:  2.1 x 2.4 cm Distal:  1.9 x 1.9 cm Patent: Yes, peak systolic velocity is 45 cm/s Right common iliac artery: Not visualized Left common iliac artery: Not visualized IMPRESSION: No evidence of abdominal aortic aneurysm. Electronically Signed   By: Julian Hy M.D.   On: 10/09/2019 22:02    Procedures Procedures (including critical care time)  Medications Ordered in ED Medications  LORazepam (ATIVAN) injection 1 mg (1 mg Intravenous Given 10/11/19 1539)  meclizine (ANTIVERT) tablet 50 mg (50 mg Oral Given 10/11/19 1538)  predniSONE (DELTASONE) tablet 60 mg (60 mg Oral Given 10/11/19 1537)  albuterol (VENTOLIN HFA) 108 (90 Base) MCG/ACT inhaler 2 puff (2 puffs Inhalation Given 10/11/19 1536)  sodium chloride 0.9 % bolus 1,000 mL (1,000 mLs Intravenous New Bag/Given 10/11/19 1554)    ED Course  I have reviewed the triage vital signs and the nursing notes.  Pertinent labs & imaging results that were available during my care of the patient were reviewed by me and considered in my medical decision making (see chart for details).  Clinical Course as of Oct 10 1652  Fri Oct 11, 2019  1535 Patient presenting with chronic symptoms of SOB, tinnitus, headache and vertigo. Previous extensive workups  in ED and admissions the past 2 months inclusive of reassuring head CT, chest xray, labs, ddimer, troponin, vq scan and echo showing preserved LV and RV function. He does have scheduled follow up with ENT and urology this month. Reports only taking HCTZ now for blood pressure. Today his chest xray, troponin, CBC are reassuring. He appears to have slowly increasing creatnine again. His baseline is usually around 1.5 but is 2.11 today. He is receiving fluids, ativan, meclizine, albuterol and prednisone for his symptoms which seem related to sinusitis in the context of menieres disease and possibly dehydration again. This seems to be a reoccurring problem. He is not orthostatic. He is improved with medications. Will treat for sinusitis and vertigo wit rx for abx, prednisone, flonase. Dr. Bearl Mulberry in agreement with plan    [KM]    Clinical Course User Index [KM] Kristine Royal   MDM Rules/Calculators/A&P                      Based on review of vitals, medical screening exam, lab work and/or imaging, there does not appear to be an acute, emergent etiology for the patient's symptoms. Counseled pt on good return precautions and encouraged both PCP and ED follow-up as needed.  Prior to discharge, I also discussed incidental imaging findings with patient in detail and advised appropriate, recommended follow-up in detail.  Clinical Impression: 1. Vertigo   2. Acute sinusitis, recurrence not specified, unspecified location   3. SOB (shortness of breath)     Disposition: Discharge  Prior to providing a prescription for a controlled substance, I independently reviewed the patient's recent prescription history on the [place]. The patient had no recent or regular prescriptions and was deemed appropriate for a brief, less than 3 day  prescription of narcotic for acute analgesia.  This note was prepared with assistance of Systems analyst. Occasional wrong-word or sound-a-like substitutions may have occurred due to the inherent limitations of voice recognition software.  Final Clinical Impression(s) / ED Diagnoses Final diagnoses:  Vertigo  Acute sinusitis, recurrence not specified, unspecified location  SOB (shortness of breath)    Rx / DC Orders ED Discharge Orders         Ordered    predniSONE (DELTASONE) 10 MG tablet  Daily     10/11/19 1630    fluticasone (FLONASE) 50 MCG/ACT nasal spray  Daily     10/11/19 1630    albuterol (VENTOLIN HFA) 108 (90 Base) MCG/ACT inhaler  Every 4 hours PRN     10/11/19 1630    amoxicillin-clavulanate (AUGMENTIN) 875-125 MG tablet  Every 12 hours     10/11/19 1630           Kristine Royal 10/11/19 1654    Valarie Merino, MD 10/12/19 1255

## 2019-10-11 NOTE — Discharge Instructions (Addendum)
Thank you for allowing me to care for you today. Your workup was reassuring. You did appear to have some mild bump in your kidney function again so we gave you IV fluids. We are also treating you for a sinus infection. You need to follow up with ENT as you are scheduled, as well as urology and primary care. Continue to take meclizine and flonase as well. Please return to the emergency department if you have new or worsening symptoms. Take your medications as instructed.

## 2020-01-20 DIAGNOSIS — C61 Malignant neoplasm of prostate: Secondary | ICD-10-CM

## 2020-03-10 DIAGNOSIS — C61 Malignant neoplasm of prostate: Secondary | ICD-10-CM | POA: Diagnosis not present

## 2020-04-16 NOTE — Progress Notes (Signed)
The following Medication: Lupron has been approved thru [employer] as Assistance Program. Enrollment period is 01/27/2020 to 01/23/2021. Assistance ID: **, medication is ordered prior to appointment to be on hand for treatment.  First DOS: 02/03/20

## 2020-04-24 ENCOUNTER — Other Ambulatory Visit: Payer: Self-pay | Admitting: Hematology and Oncology

## 2020-04-24 DIAGNOSIS — C61 Malignant neoplasm of prostate: Secondary | ICD-10-CM | POA: Insufficient documentation

## 2020-05-06 ENCOUNTER — Inpatient Hospital Stay: Attending: Oncology

## 2020-05-06 ENCOUNTER — Other Ambulatory Visit: Payer: Self-pay

## 2020-05-06 VITALS — BP 139/90 | HR 70 | Temp 98.2°F | Resp 18 | Ht 71.0 in | Wt 209.5 lb

## 2020-05-06 DIAGNOSIS — C61 Malignant neoplasm of prostate: Secondary | ICD-10-CM | POA: Insufficient documentation

## 2020-05-06 DIAGNOSIS — Z5111 Encounter for antineoplastic chemotherapy: Secondary | ICD-10-CM | POA: Insufficient documentation

## 2020-05-06 MED ORDER — LEUPROLIDE ACETATE 7.5 MG IM KIT
PACK | INTRAMUSCULAR | Status: AC
  Filled 2020-05-06: qty 7.5

## 2020-05-06 MED ORDER — LEUPROLIDE ACETATE 7.5 MG IM KIT
7.5000 mg | PACK | Freq: Once | INTRAMUSCULAR | Status: AC
  Administered 2020-05-06: 7.5 mg via INTRAMUSCULAR

## 2020-05-06 NOTE — Patient Instructions (Signed)

## 2020-05-06 NOTE — Progress Notes (Signed)
Pt stable at time of disharge.

## 2020-05-11 ENCOUNTER — Ambulatory Visit: Admitting: Oncology

## 2020-05-11 NOTE — Progress Notes (Addendum)
  Adrian Herrera  [address] [place],  [place]  [address] [phone]  Clinic Day:  05/12/2020   HISTORY OF PRESENT ILLNESS:  The patient is a 64 y.o. 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  He is currently undergoing prostatic radiation, which he is receiving in conjunction with his androgen deprivation therapy. Since his last visit, the patient has been doing okay. He claims to still be more emotionally labile ever since his androgen deprivation started.  He also claims to be more depressed since this medicine was started.  He denies having any symptoms that concern him for progression of his prostate cancer while on his definitive therapy.   PHYSICAL EXAM:  Blood pressure 140/86, pulse 69, temperature 98.3 F (36.8 C), resp. rate 20, height 5\' 11"  (1.803 m), weight 207 lb 1.6 oz (93.9 kg), SpO2 99 %. Wt Readings from Last 3 Encounters:  05/12/20 207 lb 1.6 oz (93.9 kg)  05/06/20 209 lb 8 oz (95 kg)  05/03/20 210 lb 14.4 oz (95.7 kg)   Body mass index is 28.88 kg/m. Performance status (ECOG): 1 GENERAL: The patient is alert and oriented x3, in no acute distress. HEENT EXAM: Clear oropharynx with no exudate or lesions appreciated. LUNG EXAM: Clear to auscultation bilaterally. CARDIAC EXAM: Regular rate and rhythm. No murmurs, rubs, or gallops. ABDOMINAL EXAM: Soft, nontender and nondistended; no hepatosplenomegaly; normal abdominal bowel sounds EXTREMITY EXAM: No clubbing, cyanosis, or edema. LYMPH NODE SURVEY: No palpable cervical, supraclavicular, axillary, or inguinal lymphadenopathy. NEUROLOGIC EXAM: Cranial nerves II-XII and cerebellar functions are grossly intact.   LABS:    Results for Adrian Herrera, Adrian Herrera (MRN [id number]) as of 05/13/2020 10:03  Ref. Range 10/11/2019 14:25 10/11/2019 14:41 10/11/2019 16:26 10/15/2019 09:35 05/12/2020 11:58  Prostate  Specific Ag, Serum Latest Ref Range: 0.0 - 4.0 ng/mL     0.3   ASSESSMENT & PLAN:   Assessment/Plan:  A 64 y.o. 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  His PSA level continues to fall with combination therapy.  He just received his monthly Lupron injection last week, which he is receiving in conjunction with daily external beam radiation.  His radiation will be completed on November 19th.   With respect to chronic pain, I will prescribe this gentleman tramadol 50 mg, which he can take every 6 hours as needed.  I will see him back in 1 month for repeat clinical assessment. The patient understands all the plans discussed today and is in agreement with them.      Adrian Hidalgo [name], MD

## 2020-05-12 ENCOUNTER — Inpatient Hospital Stay: Payer: Self-pay | Attending: Oncology | Admitting: Oncology

## 2020-05-12 ENCOUNTER — Other Ambulatory Visit: Payer: Self-pay | Admitting: Oncology

## 2020-05-12 ENCOUNTER — Other Ambulatory Visit: Payer: Self-pay

## 2020-05-12 ENCOUNTER — Encounter: Payer: Self-pay | Admitting: Oncology

## 2020-05-12 ENCOUNTER — Inpatient Hospital Stay: Payer: Self-pay

## 2020-05-12 VITALS — BP 140/86 | HR 69 | Temp 98.3°F | Resp 20 | Ht 71.0 in | Wt 207.1 lb

## 2020-05-12 DIAGNOSIS — C61 Malignant neoplasm of prostate: Secondary | ICD-10-CM

## 2020-05-12 DIAGNOSIS — Z79818 Long term (current) use of other agents affecting estrogen receptors and estrogen levels: Secondary | ICD-10-CM | POA: Insufficient documentation

## 2020-05-13 LAB — PROSTATE-SPECIFIC AG, SERUM (LABCORP): Prostate Specific Ag, Serum: 0.3 ng/mL (ref 0.0–4.0)

## 2020-05-19 NOTE — Progress Notes (Signed)
Patient was in for his Radiation treatment today with Dr. Orlene Erm. His S/O asked to speak with me regarding bills he was receiving. Patient has 100% financial assistance through the hospital. I made copies of the bills, they were from: [facility] for Dr. Orlene Erm, [facility], and [facility]. Sent all of them a copy of his reward letter from the hospital.

## 2020-05-21 NOTE — Progress Notes (Signed)
PT STABLE AT TIME OF DISCHARGE 

## 2020-06-02 ENCOUNTER — Inpatient Hospital Stay: Payer: Self-pay

## 2020-06-02 DIAGNOSIS — C61 Malignant neoplasm of prostate: Secondary | ICD-10-CM

## 2020-06-02 NOTE — Progress Notes (Signed)
  [facility]  [address] [place],  [place]  [address] [phone]  Clinic Day:  06/03/2020   HISTORY OF PRESENT ILLNESS:  The patient is a 64 y.o. 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  He recently completed his prostatic radiation, which he was receiving in conjunction with his androgen deprivation therapy. Since his last visit, the patient has been doing okay. He has had some urinary urgency for which he is scheduled to see urology for in the forthcoming weeks.    He denies having other symptoms which concern him for progression of his prostate cancer since being on his definitive therapy.  PHYSICAL EXAM:  Blood pressure (!) 158/90, pulse 83, temperature 98.5 F (36.9 C), resp. rate 20, height 5\' 11"  (1.803 m), weight 209 lb 8 oz (95 kg), SpO2 97 %. Wt Readings from Last 3 Encounters:  06/03/20 209 lb 8 oz (95 kg)  05/19/20 209 lb 1 oz (94.8 kg)  05/12/20 207 lb 1.6 oz (93.9 kg)   Body mass index is 29.22 kg/m. Performance status (ECOG): 1 GENERAL: The patient is alert and oriented x3, in no acute distress. HEENT EXAM: Clear oropharynx with no exudate or lesions appreciated. LUNG EXAM: Clear to auscultation bilaterally. CARDIAC EXAM: Regular rate and rhythm. No murmurs, rubs, or gallops. ABDOMINAL EXAM: Soft, nontender and nondistended; no hepatosplenomegaly; normal abdominal bowel sounds EXTREMITY EXAM: No clubbing, cyanosis, or edema. LYMPH NODE SURVEY: No palpable cervical, supraclavicular, axillary, or inguinal lymphadenopathy. NEUROLOGIC EXAM: Cranial nerves II-XII and cerebellar functions are grossly intact.   LABS:    Ref. Range 05/12/2020 11:58 06/02/2020 10:49  Prostate Specific Ag, Serum Latest Ref Range: 0.0 - 4.0 ng/mL 0.3 0.1    ASSESSMENT & PLAN:   Assessment/Plan:  A 64 y.o. male with high risk stage IIIA (T2c N0 M0,  Gleason grade 6-7, PSA >20) prostate cancer.  His PSA level continues to fall with combination therapy.  As mentioned previously, he has recently completed his radiation.  With respect to his androgen deprivation, I will start giving him Lupron 22.5 mg injections which will cover him every 3 months.  Clinically, he appears to be doing well.  I will see him back in 3 months for repeat clinical assessment. The patient understands all the plans discussed today and is in agreement with them.    Kasiah Manka [name], MD

## 2020-06-03 ENCOUNTER — Inpatient Hospital Stay (HOSPITAL_BASED_OUTPATIENT_CLINIC_OR_DEPARTMENT_OTHER): Payer: Self-pay | Admitting: Oncology

## 2020-06-03 ENCOUNTER — Inpatient Hospital Stay: Payer: Self-pay

## 2020-06-03 ENCOUNTER — Other Ambulatory Visit: Payer: Self-pay | Admitting: Oncology

## 2020-06-03 ENCOUNTER — Ambulatory Visit

## 2020-06-03 ENCOUNTER — Other Ambulatory Visit: Payer: Self-pay

## 2020-06-03 VITALS — BP 158/90 | HR 83 | Temp 98.5°F | Resp 20 | Ht 71.0 in | Wt 209.5 lb

## 2020-06-03 DIAGNOSIS — C61 Malignant neoplasm of prostate: Secondary | ICD-10-CM

## 2020-06-03 LAB — PROSTATE-SPECIFIC AG, SERUM (LABCORP): Prostate Specific Ag, Serum: 0.1 ng/mL (ref 0.0–4.0)

## 2020-06-03 MED ORDER — LEUPROLIDE ACETATE (3 MONTH) 22.5 MG IM KIT
PACK | INTRAMUSCULAR | Status: AC
  Filled 2020-06-03: qty 22.5

## 2020-06-03 MED ORDER — LEUPROLIDE ACETATE 7.5 MG IM KIT
22.5000 mg | PACK | Freq: Once | INTRAMUSCULAR | Status: AC
  Administered 2020-06-03: 22.5 mg via INTRAMUSCULAR

## 2020-06-03 NOTE — Progress Notes (Signed)
PT STABLE AT TIME OF DISCHARGE  Per Manuela Schwartz in pharmacy, pt gets free drug, no auth needed.   Barcode on Lupron would not scan.  Dose and med verified by me, Manuela Schwartz, pharmacy and [employer].

## 2020-06-03 NOTE — Patient Instructions (Signed)
Leuprolide injection What is this medicine? LEUPROLIDE (loo PROE lide) is a man-made hormone. It is used to treat the symptoms of prostate cancer. This medicine may also be used to treat children with early onset of puberty. It may be used for other hormonal conditions. This medicine may be used for other purposes; ask your health care provider or pharmacist if you have questions. COMMON BRAND NAME(S): Lupron What should I tell my health care provider before I take this medicine? They need to know if you have any of these conditions:  diabetes  heart disease or previous heart attack  high blood pressure  high cholesterol  pain or difficulty passing urine  spinal cord metastasis  stroke  tobacco smoker  an unusual or allergic reaction to leuprolide, benzyl alcohol, other medicines, foods, dyes, or preservatives  pregnant or trying to get pregnant  breast-feeding How should I use this medicine? This medicine is for injection under the skin or into a muscle. You will be taught how to prepare and give this medicine. Use exactly as directed. Take your medicine at regular intervals. Do not take your medicine more often than directed. It is important that you put your used needles and syringes in a special sharps container. Do not put them in a trash can. If you do not have a sharps container, call your pharmacist or healthcare provider to get one. A special MedGuide will be given to you by the pharmacist with each prescription and refill. Be sure to read this information carefully each time. Talk to your pediatrician regarding the use of this medicine in children. While this medicine may be prescribed for children as young as 8 years for selected conditions, precautions do apply. Overdosage: If you think you have taken too much of this medicine contact a poison control center or emergency room at once. NOTE: This medicine is only for you. Do not share this medicine with others. What if  I miss a dose? If you miss a dose, take it as soon as you can. If it is almost time for your next dose, take only that dose. Do not take double or extra doses. What may interact with this medicine? Do not take this medicine with any of the following medications:  chasteberry This medicine may also interact with the following medications:  herbal or dietary supplements, like black cohosh or DHEA  male hormones, like estrogens or progestins and birth control pills, patches, rings, or injections  male hormones, like testosterone This list may not describe all possible interactions. Give your health care provider a list of all the medicines, herbs, non-prescription drugs, or dietary supplements you use. Also tell them if you smoke, drink alcohol, or use illegal drugs. Some items may interact with your medicine. What should I watch for while using this medicine? Visit your doctor or health care professional for regular checks on your progress. During the first week, your symptoms may get worse, but then will improve as you continue your treatment. You may get hot flashes, increased bone pain, increased difficulty passing urine, or an aggravation of nerve symptoms. Discuss these effects with your doctor or health care professional, some of them may improve with continued use of this medicine. Male patients may experience a menstrual cycle or spotting during the first 2 months of therapy with this medicine. If this continues, contact your doctor or health care professional. This medicine may increase blood sugar. Ask your healthcare provider if changes in diet or medicines are needed if   you have diabetes. What side effects may I notice from receiving this medicine? Side effects that you should report to your doctor or health care professional as soon as possible:  allergic reactions like skin rash, itching or hives, swelling of the face, lips, or tongue  breathing problems  chest  pain  depression or memory disorders  pain in your legs or groin  pain at site where injected  severe headache  signs and symptoms of high blood sugar such as being more thirsty or hungry or having to urinate more than normal. You may also feel very tired or have blurry vision  swelling of the feet and legs  visual changes  vomiting Side effects that usually do not require medical attention (report to your doctor or health care professional if they continue or are bothersome):  breast swelling or tenderness  decrease in sex drive or performance  diarrhea  hot flashes  loss of appetite  muscle, joint, or bone pains  nausea  redness or irritation at site where injected  skin problems or acne This list may not describe all possible side effects. Call your doctor for medical advice about side effects. You may report side effects to FDA at 1-800-FDA-1088. Where should I keep my medicine? Keep out of the reach of children. Store below 25 degrees C (77 degrees F). Do not freeze. Protect from light. Do not use if it is not clear or if there are particles present. Throw away any unused medicine after the expiration date. NOTE: This sheet is a summary. It may not cover all possible information. If you have questions about this medicine, talk to your doctor, pharmacist, or health care provider.  2020 Elsevier/Gold Standard (2018-04-26 09:52:48)  

## 2020-09-02 ENCOUNTER — Inpatient Hospital Stay: Payer: Self-pay | Attending: Oncology

## 2020-09-02 ENCOUNTER — Other Ambulatory Visit: Payer: Self-pay

## 2020-09-02 DIAGNOSIS — Z5111 Encounter for antineoplastic chemotherapy: Secondary | ICD-10-CM | POA: Insufficient documentation

## 2020-09-02 DIAGNOSIS — C61 Malignant neoplasm of prostate: Secondary | ICD-10-CM | POA: Insufficient documentation

## 2020-09-02 NOTE — Progress Notes (Signed)
  [facility]  [address] [place],  [place]  [address] [phone]  Clinic Day:  09/03/2020   HISTORY OF PRESENT ILLNESS:  The patient is a 65 y.o. 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  He completed his prostatic radiation, which he received in conjunction with his androgen deprivation therapy. Since his last visit, the patient has been doing okay. He has had some urinary inconteince, for which he is scheduled to see urology for in the forthcoming weeks.  He also notices occasional bowel incontinence, which has been an issue since he completed his prostatic radiation.  He denies having other symptoms which concern him for progression of his prostate cancer.Marland Kitchen  PHYSICAL EXAM:  Blood pressure (!) 167/85, pulse 80, temperature 98.2 F (36.8 C), resp. rate 16, height 5\' 11"  (1.803 m), weight 205 lb 8 oz (93.2 kg), SpO2 99 %. Wt Readings from Last 3 Encounters:  09/03/20 206 lb (93.4 kg)  09/03/20 205 lb 8 oz (93.2 kg)  06/03/20 209 lb 8 oz (95 kg)   Body mass index is 28.66 kg/m. Performance status (ECOG): 1 GENERAL: The patient is alert and oriented x3, in no acute distress. HEENT EXAM: Clear oropharynx with no exudate or lesions appreciated. LUNG EXAM: Clear to auscultation bilaterally. CARDIAC EXAM: Regular rate and rhythm. No murmurs, rubs, or gallops. ABDOMINAL EXAM: Soft, nontender and nondistended; no hepatosplenomegaly; normal abdominal bowel sounds EXTREMITY EXAM: No clubbing, cyanosis, or edema. LYMPH NODE SURVEY: No palpable cervical, supraclavicular, axillary, or inguinal lymphadenopathy. NEUROLOGIC EXAM: Cranial nerves II-XII and cerebellar functions are grossly intact.   LABS:    Ref. Range 05/12/2020 11:58 06/02/2020 10:49 09/02/2020 09:49  Prostate Specific Ag, Serum Latest Ref Range: 0.0 - 4.0 ng/mL 0.3 0.1 <0.1     ASSESSMENT &  PLAN:  Assessment/Plan:  A 65 y.o. 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  I am pleased as his PSA has now fallen to an undetectable level.  He will continue to receive Lupron 22.5 mg injections every 3 months for his androgen deprivation of his high risk prostate cancer.  Clinically, he appears to be doing well.  I will see him back in another 3 months for repeat clinical assessment. The patient understands all the plans discussed today and is in agreement with them.    Dequincy [name], MD

## 2020-09-03 ENCOUNTER — Other Ambulatory Visit: Payer: Self-pay | Admitting: Oncology

## 2020-09-03 ENCOUNTER — Inpatient Hospital Stay (HOSPITAL_BASED_OUTPATIENT_CLINIC_OR_DEPARTMENT_OTHER): Payer: Self-pay | Admitting: Oncology

## 2020-09-03 ENCOUNTER — Inpatient Hospital Stay: Payer: Self-pay

## 2020-09-03 ENCOUNTER — Telehealth: Payer: Self-pay | Admitting: Oncology

## 2020-09-03 VITALS — BP 167/85 | HR 80 | Temp 98.2°F | Resp 16 | Ht 71.0 in | Wt 205.5 lb

## 2020-09-03 VITALS — BP 149/93 | HR 81 | Temp 97.8°F | Resp 18 | Ht 71.0 in | Wt 206.0 lb

## 2020-09-03 DIAGNOSIS — C61 Malignant neoplasm of prostate: Secondary | ICD-10-CM

## 2020-09-03 LAB — PROSTATE-SPECIFIC AG, SERUM (LABCORP): Prostate Specific Ag, Serum: <0.1 ng/mL (ref 0.0–4.0)

## 2020-09-03 MED ORDER — LEUPROLIDE ACETATE (3 MONTH) 22.5 MG IM KIT
PACK | INTRAMUSCULAR | Status: AC
  Filled 2020-09-03: qty 22.5

## 2020-09-03 MED ORDER — LEUPROLIDE ACETATE (3 MONTH) 22.5 MG IM KIT
22.5000 mg | PACK | Freq: Once | INTRAMUSCULAR | Status: AC
  Administered 2020-09-03: 22.5 mg via INTRAMUSCULAR

## 2020-09-03 NOTE — Telephone Encounter (Signed)
Per 2/24 los next appt  sched and given to patient 

## 2020-09-03 NOTE — Patient Instructions (Signed)
Leuprolide injection What is this medicine? LEUPROLIDE (loo PROE lide) is a man-made hormone. It is used to treat the symptoms of prostate cancer. This medicine may also be used to treat children with early onset of puberty. It may be used for other hormonal conditions. This medicine may be used for other purposes; ask your health care provider or pharmacist if you have questions. COMMON BRAND NAME(S): Lupron What should I tell my health care provider before I take this medicine? They need to know if you have any of these conditions:  diabetes  heart disease or previous heart attack  high blood pressure  high cholesterol  pain or difficulty passing urine  spinal cord metastasis  stroke  tobacco smoker  an unusual or allergic reaction to leuprolide, benzyl alcohol, other medicines, foods, dyes, or preservatives  pregnant or trying to get pregnant  breast-feeding How should I use this medicine? This medicine is for injection under the skin or into a muscle. You will be taught how to prepare and give this medicine. Use exactly as directed. Take your medicine at regular intervals. Do not take your medicine more often than directed. It is important that you put your used needles and syringes in a special sharps container. Do not put them in a trash can. If you do not have a sharps container, call your pharmacist or healthcare provider to get one. A special MedGuide will be given to you by the pharmacist with each prescription and refill. Be sure to read this information carefully each time. Talk to your pediatrician regarding the use of this medicine in children. While this medicine may be prescribed for children as young as 8 years for selected conditions, precautions do apply. Overdosage: If you think you have taken too much of this medicine contact a poison control center or emergency room at once. NOTE: This medicine is only for you. Do not share this medicine with others. What if  I miss a dose? If you miss a dose, take it as soon as you can. If it is almost time for your next dose, take only that dose. Do not take double or extra doses. What may interact with this medicine? Do not take this medicine with any of the following medications:  chasteberry  cisapride  dronedarone  pimozide  thioridazine This medicine may also interact with the following medications:  herbal or dietary supplements, like black cohosh or DHEA  male hormones, like estrogens or progestins and birth control pills, patches, rings, or injections  male hormones, like testosterone  other medicines that prolong the QT interval (abnormal heart rhythm) This list may not describe all possible interactions. Give your health care provider a list of all the medicines, herbs, non-prescription drugs, or dietary supplements you use. Also tell them if you smoke, drink alcohol, or use illegal drugs. Some items may interact with your medicine. What should I watch for while using this medicine? Visit your doctor or health care professional for regular checks on your progress. During the first week, your symptoms may get worse, but then will improve as you continue your treatment. You may get hot flashes, increased bone pain, increased difficulty passing urine, or an aggravation of nerve symptoms. Discuss these effects with your doctor or health care professional, some of them may improve with continued use of this medicine. Male patients may experience a menstrual cycle or spotting during the first 2 months of therapy with this medicine. If this continues, contact your doctor or health care professional.   This medicine may increase blood sugar. Ask your healthcare provider if changes in diet or medicines are needed if you have diabetes. What side effects may I notice from receiving this medicine? Side effects that you should report to your doctor or health care professional as soon as possible:  allergic  reactions like skin rash, itching or hives, swelling of the face, lips, or tongue  breathing problems  chest pain  depression or memory disorders  pain in your legs or groin  pain at site where injected  severe headache  signs and symptoms of high blood sugar such as being more thirsty or hungry or having to urinate more than normal. You may also feel very tired or have blurry vision  swelling of the feet and legs  visual changes  vomiting Side effects that usually do not require medical attention (report to your doctor or health care professional if they continue or are bothersome):  breast swelling or tenderness  decrease in sex drive or performance  diarrhea  hot flashes  loss of appetite  muscle, joint, or bone pains  nausea  redness or irritation at site where injected  skin problems or acne This list may not describe all possible side effects. Call your doctor for medical advice about side effects. You may report side effects to FDA at 1-800-FDA-1088. Where should I keep my medicine? Keep out of the reach of children. Store below 25 degrees C (77 degrees F). Do not freeze. Protect from light. Do not use if it is not clear or if there are particles present. Throw away any unused medicine after the expiration date. NOTE: This sheet is a summary. It may not cover all possible information. If you have questions about this medicine, talk to your doctor, pharmacist, or health care provider.  2021 Elsevier/Gold Standard (2019-05-29 10:57:41)  

## 2020-11-27 NOTE — Progress Notes (Signed)
  [facility]  [address] [place],  [place]  [address] [phone]  Clinic Day:  12/01/2020  This document serves as a record of services personally performed by Marice Potter, MD. It was created on their behalf by Curry,Lauren E, a trained medical scribe. The creation of this record is based on the scribe's personal observations and the provider's statements to them.  HISTORY OF PRESENT ILLNESS:  The patient is a 65 y.o. 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  He completed his prostatic radiation, which he received in conjunction with his androgen deprivation therapy. He continues to take Lupron every 3 months for his disease management.  Since his last visit, the patient has been doing okay. He still has issues with urinary inconteince, for which he sees urology for management.  He also notices occasional bowel incontinence, which has been an issue since he completed his prostatic radiation.  This symptom has him wanting to undergo a colonoscopy for further evaluation.  Otherwise, he denies having any new symptoms which concern him for progression of his prostate cancer.  PHYSICAL EXAM:  Blood pressure 127/87, pulse 78, temperature 98.1 F (36.7 C), resp. rate 16, height 5\' 11"  (1.803 m), weight 227 lb 8 oz (103.2 kg), SpO2 99 %. Wt Readings from Last 3 Encounters:  12/01/20 224 lb 1.3 oz (101.6 kg)  12/01/20 227 lb 8 oz (103.2 kg)  09/03/20 206 lb (93.4 kg)   Body mass index is 31.73 kg/m. Performance status (ECOG): 1 GENERAL: The patient is alert and oriented x3, in no acute distress. HEENT EXAM: Clear oropharynx with no exudate or lesions appreciated. LUNG EXAM: Clear to auscultation bilaterally. CARDIAC EXAM: Regular rate and rhythm. No murmurs, rubs, or gallops. ABDOMINAL EXAM: Soft, nontender and nondistended; no hepatosplenomegaly; normal  abdominal bowel sounds EXTREMITY EXAM: No clubbing, cyanosis, or edema. LYMPH NODE SURVEY: No palpable cervical, supraclavicular, axillary, or inguinal lymphadenopathy. NEUROLOGIC EXAM: Cranial nerves II-XII and cerebellar functions are grossly intact.   LABS:    Ref. Range 05/12/2020 11:58 06/02/2020 10:49 09/02/2020 09:49 11/30/2020  Prostate Specific Ag, Serum Latest Ref Range: 0.0 - 4.0 ng/mL 0.3 0.1 <0.1 <0.1     ASSESSMENT & PLAN:  Assessment/Plan:  A 65 y.o. 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  I am pleased as his PSA remains at an undetectable level.  He will continue to receive Lupron 22.5 mg injections every 3 months for the androgen deprivation management of his high risk prostate cancer.  Clinically, he appears to be doing well.  I will see him back in another 3 months for repeat clinical assessment. The patient understands all the plans discussed today and is in agreement with them.     I, Rita Ohara, am acting as scribe for Marice Potter, MD    I have reviewed this report as typed by the medical scribe, and it is complete and accurate.  Dequincy [name], MD

## 2020-11-30 ENCOUNTER — Other Ambulatory Visit: Payer: Self-pay

## 2020-11-30 ENCOUNTER — Inpatient Hospital Stay: Payer: Self-pay | Attending: Oncology

## 2020-11-30 DIAGNOSIS — Z79818 Long term (current) use of other agents affecting estrogen receptors and estrogen levels: Secondary | ICD-10-CM | POA: Insufficient documentation

## 2020-11-30 DIAGNOSIS — C61 Malignant neoplasm of prostate: Secondary | ICD-10-CM | POA: Insufficient documentation

## 2020-12-01 ENCOUNTER — Inpatient Hospital Stay: Payer: Self-pay

## 2020-12-01 ENCOUNTER — Encounter: Payer: Self-pay | Admitting: Hematology and Oncology

## 2020-12-01 ENCOUNTER — Inpatient Hospital Stay (HOSPITAL_BASED_OUTPATIENT_CLINIC_OR_DEPARTMENT_OTHER): Payer: Self-pay | Admitting: Oncology

## 2020-12-01 ENCOUNTER — Other Ambulatory Visit: Payer: Self-pay | Admitting: Oncology

## 2020-12-01 VITALS — BP 127/87 | HR 78 | Temp 98.1°F | Resp 16 | Ht 71.0 in | Wt 227.5 lb

## 2020-12-01 VITALS — BP 138/87 | HR 73 | Temp 98.1°F | Resp 16 | Wt 224.1 lb

## 2020-12-01 DIAGNOSIS — C61 Malignant neoplasm of prostate: Secondary | ICD-10-CM

## 2020-12-01 LAB — PROSTATE-SPECIFIC AG, SERUM (LABCORP): Prostate Specific Ag, Serum: <0.1 ng/mL (ref 0.0–4.0)

## 2020-12-01 MED ORDER — LEUPROLIDE ACETATE 7.5 MG IM KIT: 22.5000 mg | PACK | Freq: Once | INTRAMUSCULAR | Status: DC

## 2020-12-01 MED ORDER — LEUPROLIDE ACETATE (3 MONTH) 22.5 MG IM KIT
PACK | INTRAMUSCULAR | Status: AC
  Filled 2020-12-01: qty 22.5

## 2020-12-01 MED ORDER — LEUPROLIDE ACETATE (3 MONTH) 22.5 MG IM KIT
22.5000 mg | PACK | Freq: Once | INTRAMUSCULAR | Status: AC
Start: 2020-12-01 — End: 2020-12-01
  Administered 2020-12-01: 22.5 mg via INTRAMUSCULAR

## 2020-12-01 NOTE — Patient Instructions (Signed)

## 2020-12-01 NOTE — Progress Notes (Signed)
Patient called back, I did let him know that the colonoscopy would be covered if he got at [facility]. However, he would have to pay the pro fees.

## 2020-12-01 NOTE — Progress Notes (Signed)
Patient is wanting to get a Colonoscopy, Dr. Bobby Rumpf said patient wanted to find out what his financial assistance through [facility] would cover. I spoke with Samantha Crimes, financial counselor, patients financial assistance will cover the procedure but not the doctor fees. I tried to call patient and reached his voicemail which was full and I could not leave a message. Will try him back at a later date.

## 2020-12-14 NOTE — Progress Notes (Signed)
Sent in application to [facility] and [facility] for financial assistance. Patient only has Advice worker.

## 2021-01-18 NOTE — Progress Notes (Signed)
Called to speak with patient about reapplying for free Lupron through [facility]. He will be in on 07/14@10 :00am.

## 2021-01-21 NOTE — Progress Notes (Signed)
Sent in application to [facility] for free Lupron.

## 2021-02-10 NOTE — Progress Notes (Signed)
The following Medication: Lupron has been approved thru [employer] as Assistance Program. Enrollment period is 02/09/2021 to 02/08/2022.  Assistance ID: . Reason for Assistance: No insurance First DOS: 03/03/2021

## 2021-02-23 NOTE — Progress Notes (Signed)
  [facility]  [address] [place],  [place]  [address] [phone]  Clinic Day:  03/03/2021  This document serves as a record of services personally performed by Marice Potter, MD. It was created on their behalf by Curry,Lauren E, a trained medical scribe. The creation of this record is based on the scribe's personal observations and the provider's statements to them.  HISTORY OF PRESENT ILLNESS:  The patient is a 65 y.o. 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  He completed his prostatic radiation, which he received in conjunction with his androgen deprivation therapy. He continues to take Lupron every 3 months for his disease management.  Since his last visit, the patient has been doing okay. He still has issues with urinary incontinence, for which he sees urology for management.  He also notices occasional bowel incontinence, which has been an issue since he completed his prostatic radiation.  As he has also noticed a blood-tinged mucus discharge, he is scheduled to undergo a colonoscopy in the forthcoming weeks.  He denies having any new symptoms which concern him for progression of his prostate cancer.  However, his Lupron shots are leading to progressive fatigue and hot flashes.    PHYSICAL EXAM:  Blood pressure 131/89, pulse 77, temperature 98.1 F (36.7 C), resp. rate 18, height '5\' 11"'$  (1.803 m), weight 233 lb 9.6 oz (106 kg), SpO2 97 %. Wt Readings from Last 3 Encounters:  03/03/21 234 lb 1.3 oz (106.2 kg)  03/03/21 233 lb 9.6 oz (106 kg)  12/01/20 224 lb 1.3 oz (101.6 kg)   Body mass index is 32.58 kg/m. Performance status (ECOG): 1  GENERAL: The patient is alert and oriented x3, in no acute distress. HEENT EXAM: Clear oropharynx with no exudate or lesions appreciated. LUNG EXAM: Clear to auscultation bilaterally. CARDIAC EXAM: Regular rate  and rhythm. No murmurs, rubs, or gallops. ABDOMINAL EXAM: Soft, nontender and nondistended; no hepatosplenomegaly; normal abdominal bowel sounds EXTREMITY EXAM: No clubbing, cyanosis, or edema. LYMPH NODE SURVEY: No palpable cervical, supraclavicular, axillary, or inguinal lymphadenopathy. NEUROLOGIC EXAM: Cranial nerves II-XII and cerebellar functions are grossly intact.   LABS:    Ref. Range 05/12/2020 11:58 06/02/2020 10:49 09/02/2020 09:49 11/30/2020 03/02/2021  Prostate Specific Ag, Serum Latest Ref Range: 0.0 - 4.0 ng/mL 0.3 0.1 <0.1 <0.1 <0.1     ASSESSMENT & PLAN:  Assessment/Plan:  A 65 y.o. 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  I am pleased as his PSA remains at an undetectable level.  He will continue to receive Lupron 22.5 mg injections every 3 months for the androgen deprivation management of his high risk prostate cancer, including his next dose today.  Clinically, he appears to be doing well.  I will see him back in another 3 months for repeat clinical assessment. The patient understands all the plans discussed today and is in agreement with them.     I, Rita Ohara, am acting as scribe for Marice Potter, MD    I have reviewed this report as typed by the medical scribe, and it is complete and accurate.  Linzi Ohlinger [name], MD

## 2021-03-02 ENCOUNTER — Encounter: Payer: Self-pay | Admitting: Hematology and Oncology

## 2021-03-02 ENCOUNTER — Inpatient Hospital Stay: Payer: Self-pay | Attending: Oncology

## 2021-03-02 DIAGNOSIS — C61 Malignant neoplasm of prostate: Secondary | ICD-10-CM | POA: Insufficient documentation

## 2021-03-02 DIAGNOSIS — Z923 Personal history of irradiation: Secondary | ICD-10-CM | POA: Insufficient documentation

## 2021-03-02 LAB — CBC AND DIFFERENTIAL
HCT: 35 — AB (ref 41–53)
Hemoglobin: 12.1 — AB (ref 13.5–17.5)
Neutrophils Absolute: 6.32
Platelets: 127 — AB (ref 150–399)
WBC: 8.1

## 2021-03-02 LAB — BASIC METABOLIC PANEL
BUN: 13 (ref 4–21)
CO2: 19 (ref 13–22)
Chloride: 108 (ref 99–108)
Creatinine: 1.1 (ref 0.6–1.3)
Glucose: 138
Potassium: 3.4 (ref 3.4–5.3)
Sodium: 138 (ref 137–147)

## 2021-03-02 LAB — HEPATIC FUNCTION PANEL
ALT: 49 — AB (ref 10–40)
AST: 45 — AB (ref 14–40)
Alkaline Phosphatase: 75 (ref 25–125)
Bilirubin, Total: 0.9

## 2021-03-02 LAB — COMPREHENSIVE METABOLIC PANEL
Albumin: 3.9 (ref 3.5–5.0)
Calcium: 9 (ref 8.7–10.7)

## 2021-03-02 LAB — CBC: RBC: 4.13 (ref 3.87–5.11)

## 2021-03-03 ENCOUNTER — Other Ambulatory Visit: Payer: Self-pay | Admitting: Oncology

## 2021-03-03 ENCOUNTER — Inpatient Hospital Stay (HOSPITAL_BASED_OUTPATIENT_CLINIC_OR_DEPARTMENT_OTHER): Payer: Self-pay | Admitting: Oncology

## 2021-03-03 ENCOUNTER — Other Ambulatory Visit: Payer: Self-pay

## 2021-03-03 ENCOUNTER — Inpatient Hospital Stay: Payer: Self-pay

## 2021-03-03 VITALS — BP 149/86 | HR 75 | Temp 98.1°F | Resp 18 | Ht 71.0 in | Wt 234.1 lb

## 2021-03-03 VITALS — BP 131/89 | HR 77 | Temp 98.1°F | Resp 18 | Ht 71.0 in | Wt 233.6 lb

## 2021-03-03 DIAGNOSIS — C61 Malignant neoplasm of prostate: Secondary | ICD-10-CM

## 2021-03-03 LAB — PROSTATE-SPECIFIC AG, SERUM (LABCORP): Prostate Specific Ag, Serum: <0.1 ng/mL (ref 0.0–4.0)

## 2021-03-03 MED ORDER — LEUPROLIDE ACETATE (3 MONTH) 22.5 MG IM KIT
22.5000 mg | PACK | Freq: Once | INTRAMUSCULAR | Status: AC
  Administered 2021-03-03: 22.5 mg via INTRAMUSCULAR
  Filled 2021-03-03: qty 22.5

## 2021-03-03 NOTE — Patient Instructions (Signed)
Leuprolide injection What is this medication? LEUPROLIDE (loo PROE lide) is a man-made hormone. It is used to treat the symptoms of prostate cancer. This medicine may also be used to treat childrenwith early onset of puberty. It may be used for other hormonal conditions. This medicine may be used for other purposes; ask your health care provider orpharmacist if you have questions. COMMON BRAND NAME(S): Lupron What should I tell my care team before I take this medication? They need to know if you have any of these conditions: diabetes heart disease or previous heart attack high blood pressure high cholesterol pain or difficulty passing urine spinal cord metastasis stroke tobacco smoker an unusual or allergic reaction to leuprolide, benzyl alcohol, other medicines, foods, dyes, or preservatives pregnant or trying to get pregnant breast-feeding How should I use this medication? This medicine is for injection under the skin or into a muscle. You will be taught how to prepare and give this medicine. Use exactly as directed. Take your medicine at regular intervals. Do not take your medicine more often thandirected. It is important that you put your used needles and syringes in a special sharps container. Do not put them in a trash can. If you do not have a sharpscontainer, call your pharmacist or healthcare provider to get one. A special MedGuide will be given to you by the pharmacist with eachprescription and refill. Be sure to read this information carefully each time. Talk to your pediatrician regarding the use of this medicine in children. While this medicine may be prescribed for children as young as 8 years for selectedconditions, precautions do apply. Overdosage: If you think you have taken too much of this medicine contact apoison control center or emergency room at once. NOTE: This medicine is only for you. Do not share this medicine with others. What if I miss a dose? If you miss a  dose, take it as soon as you can. If it is almost time for yournext dose, take only that dose. Do not take double or extra doses. What may interact with this medication? Do not take this medicine with any of the following medications: chasteberry cisapride dronedarone pimozide thioridazine This medicine may also interact with the following medications: herbal or dietary supplements, like black cohosh or DHEA male hormones, like estrogens or progestins and birth control pills, patches, rings, or injections male hormones, like testosterone other medicines that prolong the QT interval (abnormal heart rhythm) This list may not describe all possible interactions. Give your health care provider a list of all the medicines, herbs, non-prescription drugs, or dietary supplements you use. Also tell them if you smoke, drink alcohol, or use illegaldrugs. Some items may interact with your medicine. What should I watch for while using this medication? Visit your doctor or health care professional for regular checks on your progress. During the first week, your symptoms may get worse, but then will improve as you continue your treatment. You may get hot flashes, increased bone pain, increased difficulty passing urine, or an aggravation of nerve symptoms. Discuss these effects with your doctor or health care professional, some ofthem may improve with continued use of this medicine. Male patients may experience a menstrual cycle or spotting during the first 2 months of therapy with this medicine. If this continues, contact your doctor orhealth care professional. This medicine may increase blood sugar. Ask your healthcare provider if changesin diet or medicines are needed if you have diabetes. What side effects may I notice from receiving this medication? Side   effects that you should report to your doctor or health care professionalas soon as possible: allergic reactions like skin rash, itching or hives,  swelling of the face, lips, or tongue breathing problems chest pain depression or memory disorders pain in your legs or groin pain at site where injected severe headache signs and symptoms of high blood sugar such as being more thirsty or hungry or having to urinate more than normal. You may also feel very tired or have blurry vision swelling of the feet and legs visual changes vomiting Side effects that usually do not require medical attention (report to yourdoctor or health care professional if they continue or are bothersome): breast swelling or tenderness decrease in sex drive or performance diarrhea hot flashes loss of appetite muscle, joint, or bone pains nausea redness or irritation at site where injected skin problems or acne This list may not describe all possible side effects. Call your doctor for medical advice about side effects. You may report side effects to FDA at1-800-FDA-1088. Where should I keep my medication? Keep out of the reach of children. Store below 25 degrees C (77 degrees F). Do not freeze. Protect from light. Do not use if it is not clear or if there are particles present. Throw away anyunused medicine after the expiration date. NOTE: This sheet is a summary. It may not cover all possible information. If you have questions about this medicine, talk to your doctor, pharmacist, orhealth care provider.  2022 Elsevier/Gold Standard (2019-05-29 10:57:41)  

## 2021-03-11 ENCOUNTER — Telehealth: Payer: Self-pay | Admitting: Oncology

## 2021-03-11 NOTE — Telephone Encounter (Signed)
Per 9/1 LOS, patient scheduled for Nov Appt's.  Gave patient Appt Summary

## 2021-03-28 IMAGING — DX DG CHEST 1V
1 series · 1 of 1 positions shown · non-contrast
Comparison: 09/17/2019

CLINICAL DATA: Dyspnea

EXAM:
CHEST  1 VIEW

[chest ap]
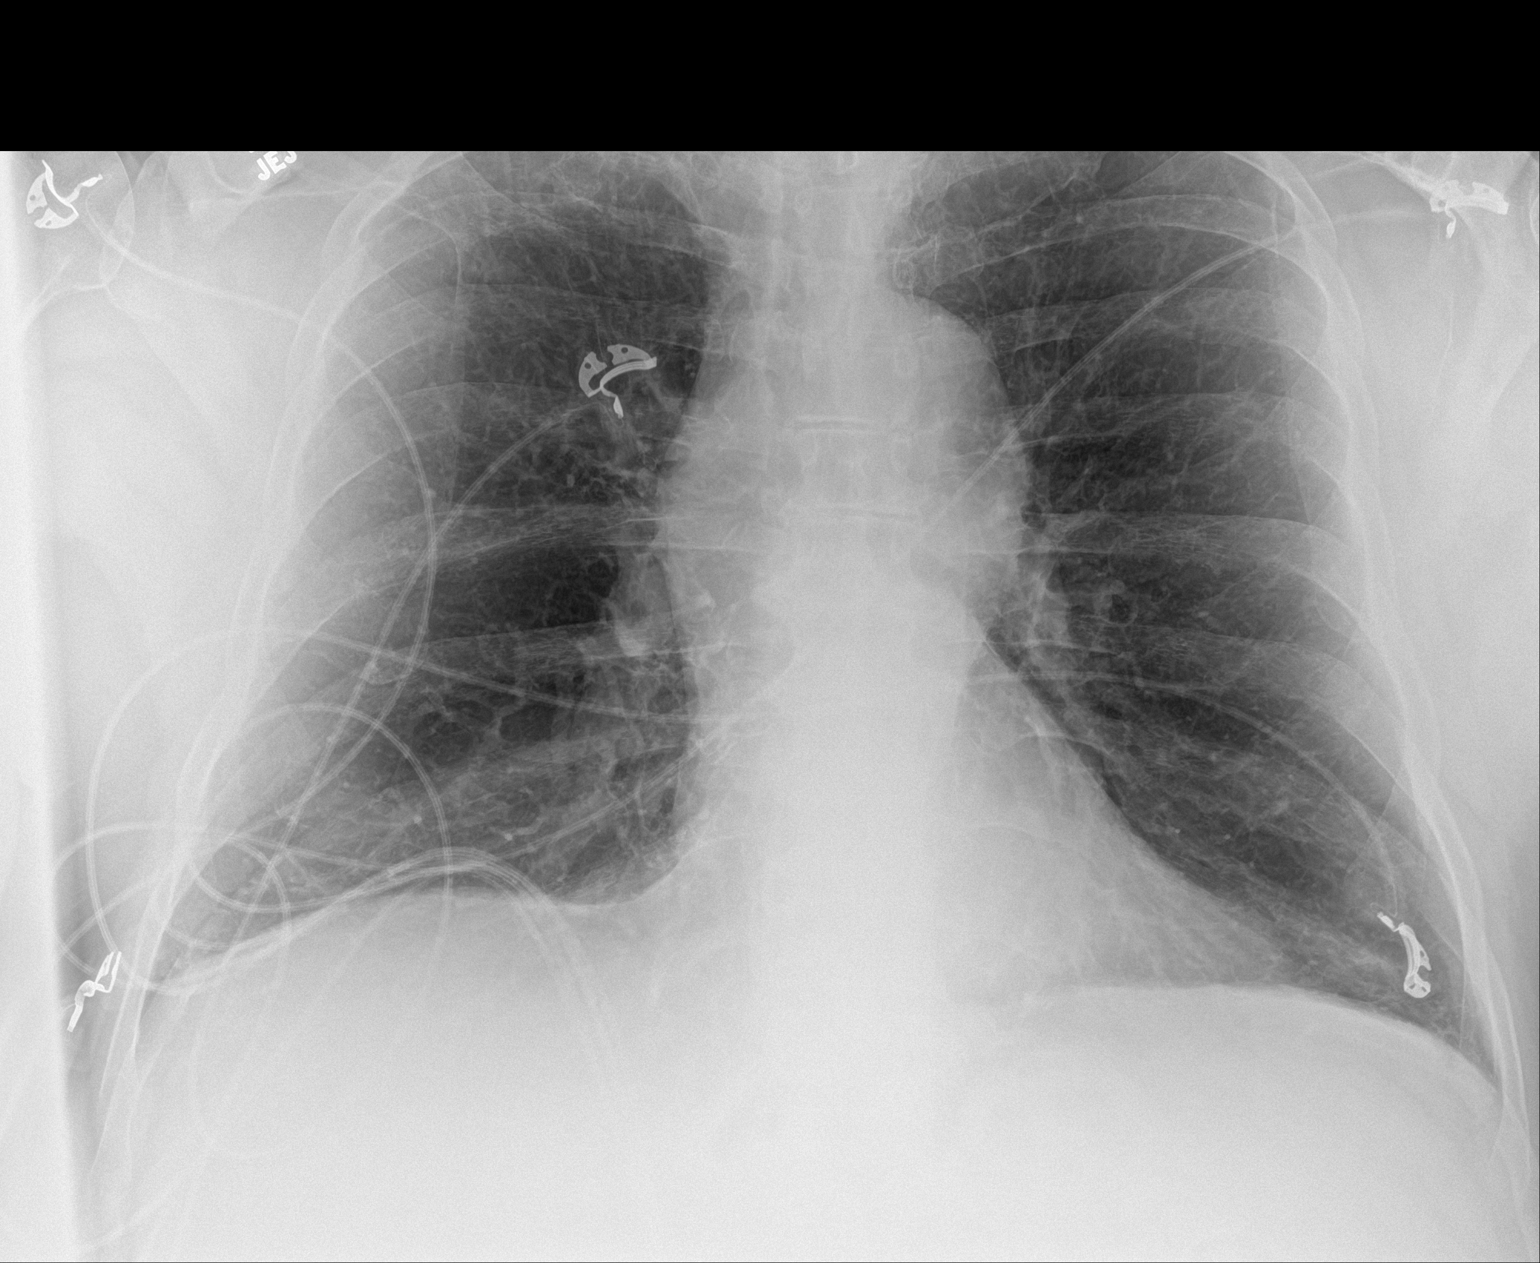

[1 of 1 positions shown; findings below may reference images not displayed]

FINDINGS: Cardiac shadow is stable. Tortuous thoracic aorta is noted. The
lungs are well aerated bilaterally. No focal infiltrate or sizable
effusion is seen. No acute bony abnormality is noted.
IMPRESSION: No acute abnormality noted.

## 2021-03-28 IMAGING — NM NM PULMONARY PERF PARTICULATE
8 series · 8 of 8 positions shown · non-contrast
Comparison: Chest radiograph 09/18/2018

CLINICAL DATA: Low to intermediate probability. Negative D-dimer.
Evaluate for PE

EXAM:
NUCLEAR MEDICINE PERFUSION LUNG SCAN
TECHNIQUE: Perfusion images were obtained in multiple projections after
intravenous injection of radiopharmaceutical.
Ventilation scans intentionally deferred if perfusion scan and chest
x-ray adequate for interpretation during COVID 19 epidemic.
RADIOPHARMACEUTICALS:  1.6 mCi 7c-11m MAA IV

[Series 1: ant/post perf · 4.14mm/px · 1 of 1 slices shown (1 of 2)]
[im 1/1]
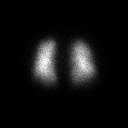

[Series 1: ant/post perf · 4.14mm/px · 1 of 1 slices shown (2 of 2)]
[im 1/1]
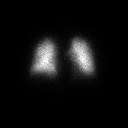

[Series 2: lao/rpo perf · 4.14mm/px · 1 of 1 slices shown (1 of 2)]
[im 1/1]
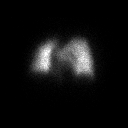

[Series 2: lao/rpo perf · 4.14mm/px · 1 of 1 slices shown (2 of 2)]
[im 1/1]
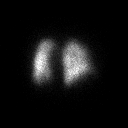

[Series 3: lpo/rao perf · 4.14mm/px · 1 of 1 slices shown (1 of 2)]
[im 1/1]
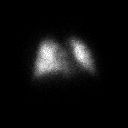

[Series 3: lpo/rao perf · 4.14mm/px · 1 of 1 slices shown (2 of 2)]
[im 1/1]
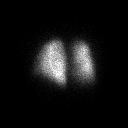

[Series 4: lt lat/rt lat perf · 4.14mm/px · 1 of 1 slices shown (1 of 2)]
[im 1/1]
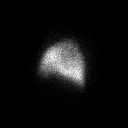

[Series 4: lt lat/rt lat perf · 4.14mm/px · 1 of 1 slices shown (2 of 2)]
[im 1/1]
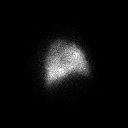

[8 of 8 positions shown; findings below may reference images not displayed]

FINDINGS: No peripheral or wedge-shaped segmental perfusion defects identified
to suggest acute or chronic pulmonary embolus.
IMPRESSION: Negative for pulmonary embolus.

## 2021-04-18 IMAGING — DX DG CHEST 1V PORT
1 series · 1 of 1 positions shown · non-contrast
Comparison: Radiographs 09/18/2019 and 09/17/2019.

CLINICAL DATA: Syncopal episode today. Nausea, dizziness and
left-sided chest pain.

EXAM:
PORTABLE CHEST 1 VIEW

[chest ap]
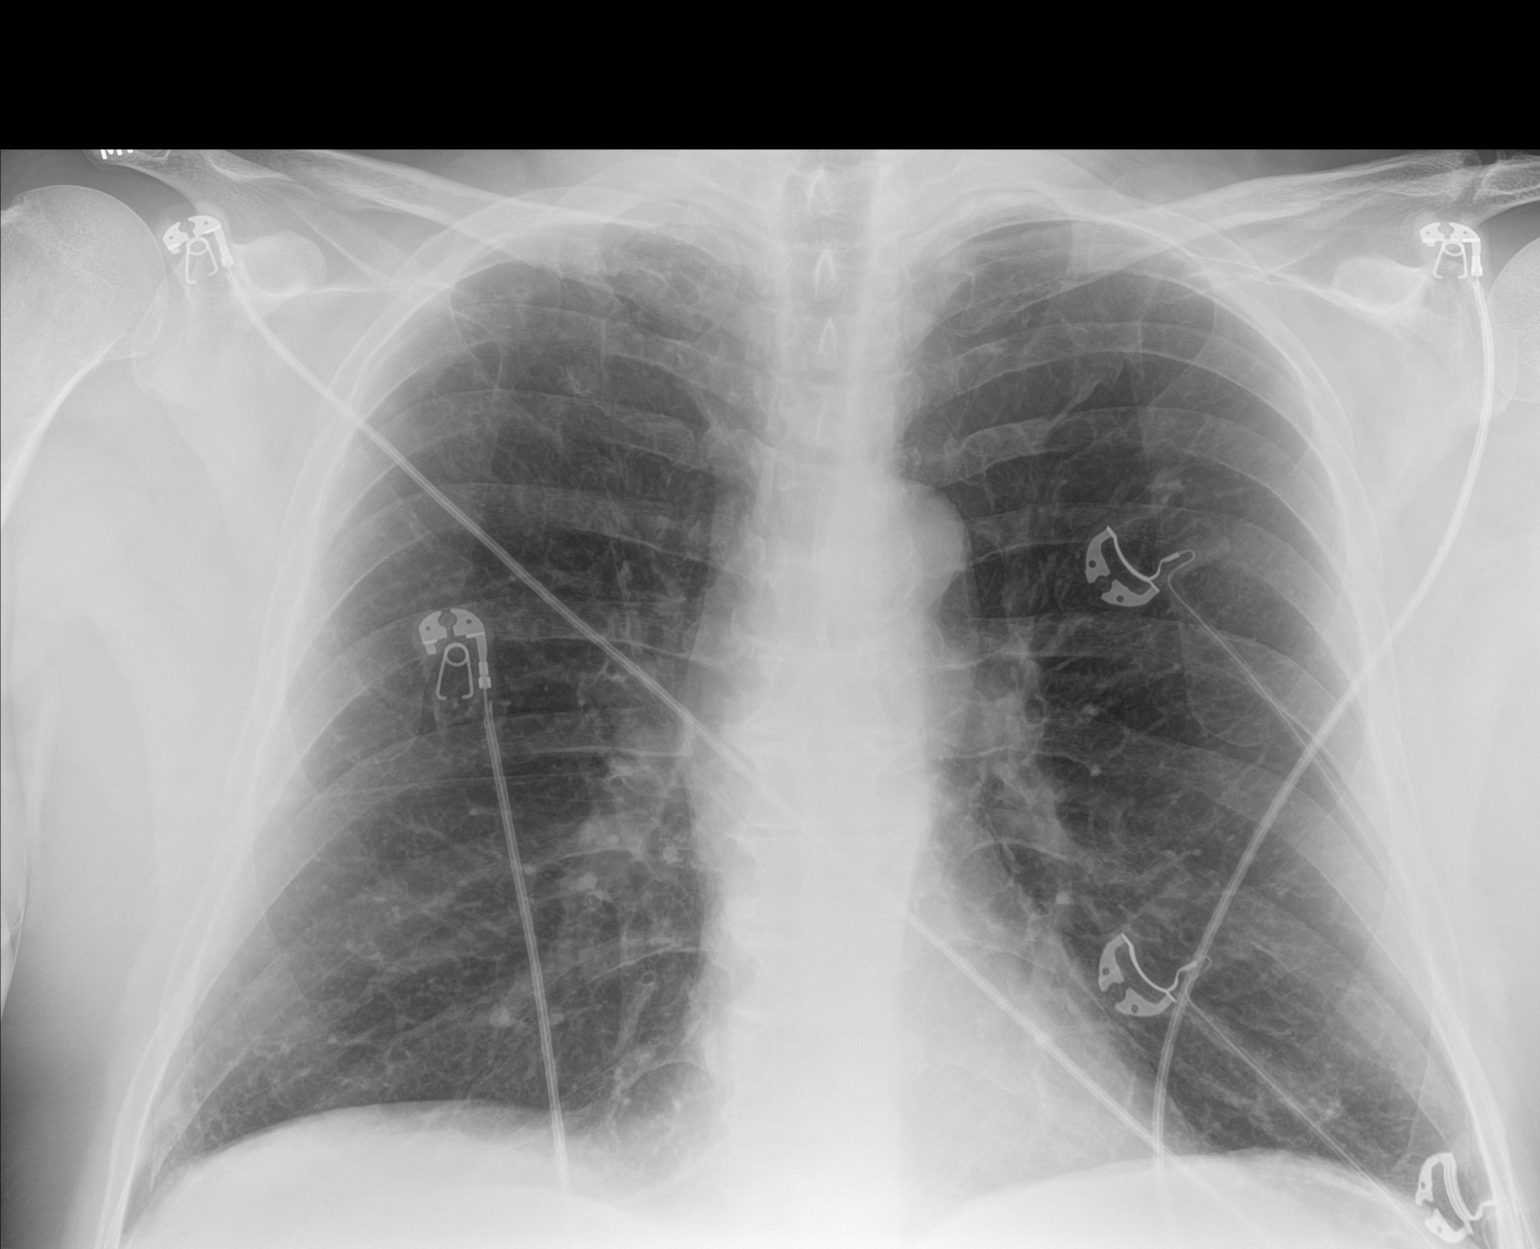

[1 of 1 positions shown; findings below may reference images not displayed]

FINDINGS: 9848 hours. The heart size and mediastinal contours are stable. The
lungs are hyperinflated with mild biapical scarring. There is no
edema, confluent airspace opacity, pleural effusion or pneumothorax.
The bones appear unremarkable. Telemetry leads overlie the chest.
IMPRESSION: Stable chest with evidence of emphysema. No acute cardiopulmonary
process.

## 2021-05-25 ENCOUNTER — Encounter: Payer: Self-pay | Admitting: Hematology and Oncology

## 2021-05-26 ENCOUNTER — Encounter: Payer: Self-pay | Admitting: Gastroenterology

## 2021-05-26 NOTE — Progress Notes (Signed)
  [facility]  [address] [place],  [place]  [address] [phone]  Clinic Day:  06/02/2021  This document serves as a record of services personally performed by Marice Potter, MD. It was created on their behalf by Curry,Lauren E, a trained medical scribe. The creation of this record is based on the scribe's personal observations and the provider's statements to them.  HISTORY OF PRESENT ILLNESS:  The patient is a 65 y.o. 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  He completed his prostatic radiation, which he received in conjunction with his androgen deprivation therapy. He continues to take Lupron every 3 months for his disease management.  Since his last visit, the patient has been doing okay. He denies having any new symptoms which concern him for progression of his prostate cancer.  However, his Lupron shots make him very fatigued.    PHYSICAL EXAM:  Blood pressure (!) 132/93, pulse 71, temperature 98.1 F (36.7 C), resp. rate 18, height 5\' 11"  (1.803 m), weight 238 lb 11.2 oz (108.3 kg), SpO2 97 %. Wt Readings from Last 3 Encounters:  06/02/21 238 lb 11.2 oz (108.3 kg)  03/03/21 234 lb 1.3 oz (106.2 kg)  03/03/21 233 lb 9.6 oz (106 kg)   Body mass index is 33.29 kg/m. Performance status (ECOG): 1  GENERAL: The patient is alert and oriented x3, in no acute distress. HEENT EXAM: Clear oropharynx with no exudate or lesions appreciated. LUNG EXAM: Clear to auscultation bilaterally. CARDIAC EXAM: Regular rate and rhythm. No murmurs, rubs, or gallops. ABDOMINAL EXAM: Soft, nontender and nondistended; no hepatosplenomegaly; normal abdominal bowel sounds EXTREMITY EXAM: No clubbing, cyanosis, or edema. LYMPH NODE SURVEY: No palpable cervical, supraclavicular, axillary, or inguinal lymphadenopathy. NEUROLOGIC EXAM: Cranial nerves II-XII and cerebellar  functions are grossly intact.   LABS:    Latest Reference Range & Units 06/01/21 14:11  Prostatic Specific Antigen 0.00 - 4.00 ng/mL <0.01    ASSESSMENT & PLAN:  Assessment/Plan:  A 65 y.o. 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  I am pleased as his PSA remains at an undetectable level.  He will continue to receive Lupron 22.5 mg injections every 3 months for the androgen deprivation management of his high risk prostate cancer, including his next dose today.  He will continue taking Lupron for his high risk disease until the Summer of 2023.  I will see him back in another 3 months for repeat clinical assessment. The patient understands all the plans discussed today and is in agreement with them.     I, Rita Ohara, am acting as scribe for Marice Potter, MD    I have reviewed this report as typed by the medical scribe, and it is complete and accurate.  Dequincy [name], MD

## 2021-06-01 ENCOUNTER — Inpatient Hospital Stay: Payer: Self-pay | Attending: Oncology

## 2021-06-01 ENCOUNTER — Other Ambulatory Visit: Payer: Self-pay | Admitting: Hematology and Oncology

## 2021-06-01 DIAGNOSIS — C61 Malignant neoplasm of prostate: Secondary | ICD-10-CM | POA: Insufficient documentation

## 2021-06-01 LAB — CBC AND DIFFERENTIAL
HCT: 38 — AB (ref 41–53)
Hemoglobin: 12.8 — AB (ref 13.5–17.5)
Neutrophils Absolute: 5.11
Platelets: 131 — AB (ref 150–399)
WBC: 7.4

## 2021-06-01 LAB — BASIC METABOLIC PANEL
BUN: 14 (ref 4–21)
CO2: 23 — AB (ref 13–22)
Chloride: 106 (ref 99–108)
Creatinine: 1.1 (ref 0.6–1.3)
Glucose: 120
Potassium: 3.9 (ref 3.4–5.3)
Sodium: 137 (ref 137–147)

## 2021-06-01 LAB — CBC
MCV: 84 (ref 80–94)
RBC: 4.5 (ref 3.87–5.11)

## 2021-06-01 LAB — HEPATIC FUNCTION PANEL
ALT: 45 — AB (ref 10–40)
AST: 38 (ref 14–40)
Alkaline Phosphatase: 89 (ref 25–125)
Bilirubin, Total: 0.4

## 2021-06-01 LAB — PSA: Prostatic Specific Antigen: <0.01 ng/mL (ref 0.00–4.00)

## 2021-06-01 LAB — COMPREHENSIVE METABOLIC PANEL
Albumin: 4 (ref 3.5–5.0)
Calcium: 8.9 (ref 8.7–10.7)

## 2021-06-02 ENCOUNTER — Other Ambulatory Visit: Payer: Self-pay

## 2021-06-02 ENCOUNTER — Inpatient Hospital Stay (HOSPITAL_BASED_OUTPATIENT_CLINIC_OR_DEPARTMENT_OTHER): Payer: Self-pay | Admitting: Oncology

## 2021-06-02 ENCOUNTER — Inpatient Hospital Stay: Payer: Self-pay

## 2021-06-02 ENCOUNTER — Other Ambulatory Visit: Payer: Self-pay | Admitting: Oncology

## 2021-06-02 ENCOUNTER — Telehealth: Payer: Self-pay | Admitting: Oncology

## 2021-06-02 VITALS — BP 130/82 | HR 77 | Temp 98.1°F | Resp 18

## 2021-06-02 VITALS — BP 132/93 | HR 71 | Temp 98.1°F | Resp 18 | Ht 71.0 in | Wt 238.7 lb

## 2021-06-02 DIAGNOSIS — C61 Malignant neoplasm of prostate: Secondary | ICD-10-CM

## 2021-06-02 MED ORDER — LEUPROLIDE ACETATE (3 MONTH) 22.5 MG IM KIT
22.5000 mg | PACK | Freq: Once | INTRAMUSCULAR | Status: AC
  Administered 2021-06-02: 22.5 mg via INTRAMUSCULAR

## 2021-06-02 NOTE — Patient Instructions (Signed)
Leuprolide depot injection What is this medication? LEUPROLIDE (loo PROE lide) is a man-made protein that acts like a natural hormone in the body. It decreases testosterone in men and decreases estrogen in women. In men, this medicine is used to treat advanced prostate cancer. In women, some forms of this medicine may be used to treat endometriosis, uterine fibroids, or other male hormone-related problems. This medicine may be used for other purposes; ask your health care provider or pharmacist if you have questions. COMMON BRAND NAME(S): Eligard, Fensolv, Lupron Depot, Lupron Depot-Ped, Viadur What should I tell my care team before I take this medication? They need to know if you have any of these conditions: diabetes heart disease or previous heart attack high blood pressure high cholesterol mental illness osteoporosis pain or difficulty passing urine seizures spinal cord metastasis stroke suicidal thoughts, plans, or attempt; a previous suicide attempt by you or a family member tobacco smoker unusual vaginal bleeding (women) an unusual or allergic reaction to leuprolide, benzyl alcohol, other medicines, foods, dyes, or preservatives pregnant or trying to get pregnant breast-feeding How should I use this medication? This medicine is for injection into a muscle or for injection under the skin. It is given by a health care professional in a hospital or clinic setting. The specific product will determine how it will be given to you. Make sure you understand which product you receive and how often you will receive it. Talk to your pediatrician regarding the use of this medicine in children. Special care may be needed. Overdosage: If you think you have taken too much of this medicine contact a poison control center or emergency room at once. NOTE: This medicine is only for you. Do not share this medicine with others. What if I miss a dose? It is important not to miss a dose. Call your  doctor or health care professional if you are unable to keep an appointment. Depot injections: Depot injections are given either once-monthly, every 12 weeks, every 16 weeks, or every 24 weeks depending on the product you are prescribed. The product you are prescribed will be based on if you are male or male, and your condition. Make sure you understand your product and dosing. What may interact with this medication? Do not take this medicine with any of the following medications: chasteberry cisapride dronedarone pimozide thioridazine This medicine may also interact with the following medications: herbal or dietary supplements, like black cohosh or DHEA male hormones, like estrogens or progestins and birth control pills, patches, rings, or injections male hormones, like testosterone other medicines that prolong the QT interval (abnormal heart rhythm) This list may not describe all possible interactions. Give your health care provider a list of all the medicines, herbs, non-prescription drugs, or dietary supplements you use. Also tell them if you smoke, drink alcohol, or use illegal drugs. Some items may interact with your medicine. What should I watch for while using this medication? Visit your doctor or health care professional for regular checks on your progress. During the first weeks of treatment, your symptoms may get worse, but then will improve as you continue your treatment. You may get hot flashes, increased bone pain, increased difficulty passing urine, or an aggravation of nerve symptoms. Discuss these effects with your doctor or health care professional, some of them may improve with continued use of this medicine. Male patients may experience a menstrual cycle or spotting during the first months of therapy with this medicine. If this continues, contact your doctor or  health care professional. This medicine may increase blood sugar. Ask your healthcare provider if changes in diet  or medicines are needed if you have diabetes. What side effects may I notice from receiving this medication? Side effects that you should report to your doctor or health care professional as soon as possible: allergic reactions like skin rash, itching or hives, swelling of the face, lips, or tongue breathing problems chest pain depression or memory disorders pain in your legs or groin pain at site where injected or implanted seizures severe headache signs and symptoms of high blood sugar such as being more thirsty or hungry or having to urinate more than normal. You may also feel very tired or have blurry vision swelling of the feet and legs suicidal thoughts or other mood changes visual changes vomiting Side effects that usually do not require medical attention (report to your doctor or health care professional if they continue or are bothersome): breast swelling or tenderness decrease in sex drive or performance diarrhea hot flashes loss of appetite muscle, joint, or bone pains nausea redness or irritation at site where injected or implanted skin problems or acne This list may not describe all possible side effects. Call your doctor for medical advice about side effects. You may report side effects to FDA at 1-800-FDA-1088. Where should I keep my medication? This drug is given in a hospital or clinic and will not be stored at home. NOTE: This sheet is a summary. It may not cover all possible information. If you have questions about this medicine, talk to your doctor, pharmacist, or health care provider.  2022 [employer]vier/Gold Standard (2021-03-16 00:00:00)

## 2021-06-02 NOTE — Telephone Encounter (Signed)
Per 11/23 los next appt scheduled and given to patient

## 2021-06-25 ENCOUNTER — Other Ambulatory Visit: Payer: Self-pay

## 2021-06-25 ENCOUNTER — Ambulatory Visit (INDEPENDENT_AMBULATORY_CARE_PROVIDER_SITE_OTHER): Payer: Self-pay | Admitting: Gastroenterology

## 2021-06-25 ENCOUNTER — Encounter: Payer: Self-pay | Admitting: Gastroenterology

## 2021-06-25 VITALS — BP 148/94 | HR 85 | Ht 71.0 in | Wt 234.5 lb

## 2021-06-25 DIAGNOSIS — K5900 Constipation, unspecified: Secondary | ICD-10-CM

## 2021-06-25 DIAGNOSIS — K625 Hemorrhage of anus and rectum: Secondary | ICD-10-CM

## 2021-06-25 DIAGNOSIS — K219 Gastro-esophageal reflux disease without esophagitis: Secondary | ICD-10-CM

## 2021-06-25 DIAGNOSIS — Z86010 Personal history of colonic polyps: Secondary | ICD-10-CM

## 2021-06-25 DIAGNOSIS — R1319 Other dysphagia: Secondary | ICD-10-CM

## 2021-06-25 MED ORDER — OMEPRAZOLE 20 MG PO CPDR: 20.0000 mg | DELAYED_RELEASE_CAPSULE | Freq: Every day | ORAL | 4 refills | Status: AC

## 2021-06-25 NOTE — Progress Notes (Signed)
Chief Complaint: For GI evaluation  Referring Provider:  Earlyne Iba, NP      ASSESSMENT AND PLAN;   #1. Rectal bleeding. Hb 12.8. D/d XRT proctitis, hemorrhoids, r/o neoplasms.  #2. Constipation exacerbated by meds ([facility])  #3. H/O polyps   #4. GERD with eso dysphagia  Plan: -EGD with dil/Colon with 2 day prep. -Omeprazole 20mg  po qd #90, 4 RF -High-fiber diet.  He can also start taking fiber supplements. -Minimize pain medications. -Increase water intake. -Once above WU is complete, would need [place] abdo (r/o any underlying liver problems-has borderline plt, ALT).  Not willing to get it done currently     I discussed EGD/Colonoscopy- the indications, risks, alternatives and potential complications including, but not limited to, bleeding, infection, reaction to medication, damage to internal organs, cardiac and/or pulmonary problems, and perforation requiring surgery. The possibility that significant findings could be missed was explained. All ? were answered. The patient gives consent to proceed.  HPI:    Adrian Herrera is a 65 y.o. 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 Has blood occasionally mixed with the stool.  Has hard Bms.  Better with fiber supplements.  Of note that he also is on amlodipine, oxycodone.  He does drink plenty of water.  Also complains of heartburn and occasional solid food dysphagia x 4 to 5 years.  Not getting worse.  Intermittent.  Mostly in the lower chest.  No odynophagia or dysphagia.  No abdominal or rectal pain.  No fever chills or night sweats.  He was seen by Dr. Latricia Heft and scheduled for colonoscopy.  Few days prior, he canceled d/t financial reasons.  He [facility] patient assistance at [facility].  He would like to get endoscopic procedures done  here.  Denies having any weight loss.  No alcohol abuse.  Prv Colon 2017 @ [facility] - 2 polyps per pt's wife-  got letter for rpt colon.  No complications of anesthesia-Per patient he woke up during the procedure. Past Medical History:  Diagnosis Date   BPH with elevated PSA    Complication of anesthesia    " i HAD A COMPLICATION AT [facility] , BUT I RECALL WHAT IT WAS "   Dyslipidemia    GERD (gastroesophageal reflux disease)    Hypertension    Malignant neoplasm of prostate ([place])    Malignant neoplasm of prostate ([facility])    Neuropathy    Prostate cancer ([place])    Tinnitus     Past Surgical History:  Procedure Laterality Date   FACIAL FRACTURE SURGERY     "I run into a crow bar about 4 times"    Family History  Problem Relation Age of Onset   CAD Mother    Parkinson's disease Father    Dementia Father    Colon cancer Neg Hx    Rectal cancer Neg Hx    Stomach cancer Neg Hx    Esophageal cancer Neg Hx     Social History   Tobacco Use   Smoking status: Former    Packs/day: 2.00    Years: 15.00    Pack years: 30.00    Types: Cigarettes   Smokeless tobacco: Never  Substance Use Topics   Alcohol use: Not Currently   Drug use: Not Currently    Types:  Marijuana    Current Outpatient Medications  Medication Sig Dispense Refill   albuterol (VENTOLIN HFA) 108 (90 Base) MCG/ACT inhaler Inhale 2 puffs into the lungs every 4 (four) hours as needed for wheezing or shortness of breath. 8 g 0   amLODipine (NORVASC) 10 MG tablet Take 1 tablet by mouth daily.     amoxicillin-clavulanate (AUGMENTIN) 875-125 MG tablet Take 1 tablet by mouth every 12 (twelve) hours. 14 tablet 0   aspirin EC 81 MG tablet Take 81 mg by mouth daily.     atorvastatin (LIPITOR) 10 MG tablet Take 10 mg by mouth every evening.      atorvastatin (LIPITOR) 20 MG tablet Take 1 tablet by mouth daily.     benzonatate (TESSALON) 100 MG capsule Take by mouth.     diclofenac Sodium (VOLTAREN) 1 % GEL  APPLY 1/2 (ONE-HALF) GRAM 4 TIMES DAILY AS NEEDED FOR PAIN     doxazosin (CARDURA) 2 MG tablet Take 2 mg by mouth 2 (two) times daily.     DULoxetine (CYMBALTA) 20 MG capsule Take 20 mg by mouth daily.     DULoxetine (CYMBALTA) 30 MG capsule Take 30 mg by mouth 2 (two) times daily.     fluticasone (FLONASE) 50 MCG/ACT nasal spray Place 2 sprays into both nostrils daily for 7 days. 9.9 mL 2   gabapentin (NEURONTIN) 100 MG capsule Take 100 mg by mouth 3 (three) times daily.     gabapentin (NEURONTIN) 300 MG capsule Take 1 capsule by mouth every 8 (eight) hours.     hydrochlorothiazide (HYDRODIURIL) 25 MG tablet Take 25 mg by mouth daily.     HYDROcodone-acetaminophen (NORCO/VICODIN) 5-325 MG tablet Take 1 tablet by mouth every 4 (four) hours as needed. for pain     leuprolide (LUPRON) 7.5 MG injection Inject 7.5 mg into the muscle every 28 (twenty-eight) days.     lidocaine (LIDODERM) 5 % APPLY ONE PATCH TOPICALLY TO CLEAN, DRY SKIN. LEAVE ON FOR 12 HOURS THEN REMOVE. MUST WAIT AN ADDITIONAL 12 HOURS BEFORE APPLYING PATCH(ES) AGAIN.     losartan (COZAAR) 100 MG tablet Take 1 tablet by mouth daily.     meclizine (ANTIVERT) 25 MG tablet Take 1 tablet (25 mg total) by mouth 3 (three) times daily as needed for dizziness. 30 tablet 0   oxyCODONE-acetaminophen (PERCOCET/ROXICET) 5-325 MG tablet Take by mouth every 4 (four) hours as needed for severe pain.     tadalafil (CIALIS) 5 MG tablet Take 5 mg by mouth daily as needed for erectile dysfunction.     No current facility-administered medications for this visit.    No Known Allergies  Review of Systems:  Constitutional: Denies fever, chills, diaphoresis, appetite change and has fatigue.  HEENT: Denies photophobia, eye pain, redness, hearing loss, ear pain, congestion, sore throat, rhinorrhea, sneezing, mouth sores, neck pain, neck stiffness and tinnitus.   Respiratory: Denies SOB, DOE, cough, chest tightness,  and wheezing.   Cardiovascular: Denies  chest pain, palpitations and leg swelling.  Genitourinary: Denies dysuria, urgency, frequency, hematuria, flank pain and difficulty urinating.  Musculoskeletal: Denies myalgias, has back pain, joint swelling, arthralgias and gait problem.  Skin: No rash.  Neurological: Denies dizziness, seizures, syncope, weakness, light-headedness, numbness and headaches.  Hematological: Denies adenopathy. Easy bruising, personal or family bleeding history  Psychiatric/Behavioral: Has anxiety or depression     Physical Exam:    BP (!) 148/94    Pulse 85    Ht 5\' 11"  (1.803 m)    Wt  234 lb 8 oz (106.4 kg)    SpO2 96%    BMI 32.71 kg/m  Wt Readings from Last 3 Encounters:  06/25/21 234 lb 8 oz (106.4 kg)  06/02/21 238 lb 11.2 oz (108.3 kg)  03/03/21 234 lb 1.3 oz (106.2 kg)   Constitutional:  Well-developed, in no acute distress. Psychiatric: Normal mood and affect. Behavior is normal. HEENT: Pupils normal.  Conjunctivae are normal. No scleral icterus. Cardiovascular: Normal rate, regular rhythm. No edema Pulmonary/chest: Effort normal and breath sounds normal. No wheezing, rales or rhonchi. Abdominal: Soft, nondistended. Nontender. Bowel sounds active throughout. There are no masses palpable. No definite hepatomegaly. Rectal: To be performed at the time of colonoscopy Neurological: Alert and oriented to person place and time. Skin: Skin is warm and dry. No rashes noted.  Data Reviewed: I have personally reviewed following labs and imaging studies  CBC: CBC Latest Ref Rng & Units 06/01/2021 03/02/2021 10/11/2019  WBC - 7.4 8.1 8.6  Hemoglobin 13.5 - 17.5 12.8(A) 12.1(A) 14.5  Hematocrit 41 - 53 38(A) 35(A) 43.9  Platelets 150 - 399 131(A) 127(A) 143(L)    CMP: CMP Latest Ref Rng & Units 06/01/2021 03/02/2021 10/11/2019  Glucose 70 - 99 mg/dL - - 112(H)  BUN 4 - 21 14 13 23   Creatinine 0.6 - 1.3 1.1 1.1 2.11(H)  Sodium 137 - 147 137 138 139  Potassium 3.4 - 5.3 3.9 3.4 4.5  Chloride 99 - 108 106  108 104  CO2 13 - 22 23(A) 19 23  Calcium 8.7 - 10.7 8.9 9.0 9.6  Total Protein 6.5 - 8.1 g/dL - - -  Total Bilirubin 0.3 - 1.2 mg/dL - - -  Alkaline Phos 25 - 125 89 75 -  AST 14 - 40 38 45(A) -  ALT 10 - 40 45(A) 49(A) -       Carmell Austria, MD 06/25/2021, 11:22 AM  Cc: Earlyne Iba, NP

## 2021-06-25 NOTE — Patient Instructions (Addendum)
If you are age 64 or older, your body mass index should be between 23-30. Your Body mass index is 32.71 kg/m. If this is out of the aforementioned range listed, please consider follow up with your Primary Care Provider.  If you are age 62 or younger, your body mass index should be between 19-25. Your Body mass index is 32.71 kg/m. If this is out of the aformentioned range listed, please consider follow up with your Primary Care Provider.   ________________________________________________________  The [facility] providers would like to encourage you to use [facility] to communicate with providers for non-urgent requests or questions.  Due to long hold times on the telephone, sending your provider a message by [facility] may be a faster and more efficient way to get a response.  Please allow 48 business hours for a response.  Please remember that this is for non-urgent requests.  _______________________________________________________  Dennis Bast have been scheduled for an endoscopy and colonoscopy. Please follow the written instructions given to you at your visit today. Please pick up your prep supplies at the pharmacy within the next 1-3 days. If you use inhalers (even only as needed), please bring them with you on the day of your procedure.  We have given you samples of the following medication to take: Clenpiq  Two days before your procedure: Mix 3 packs (or capfuls) of Miralax in 48 ounces of clear liquid and drink at 6pm.  Thank you,  Dr. Jackquline Denmark

## 2021-08-11 ENCOUNTER — Encounter: Payer: Self-pay | Admitting: Gastroenterology

## 2021-08-17 ENCOUNTER — Encounter: Payer: Self-pay | Admitting: Gastroenterology

## 2021-08-27 ENCOUNTER — Encounter: Payer: Self-pay | Admitting: Gastroenterology

## 2021-08-30 NOTE — Progress Notes (Signed)
°  [facility]  [address] [place],  [place]  [address] [phone]  Clinic Day:  09/03/2021  This document serves as a record of services personally performed by Marice Potter, MD. It was created on their behalf by Curry,Lauren E, a trained medical scribe. The creation of this record is based on the scribe's personal observations and the provider's statements to them.  HISTORY OF PRESENT ILLNESS:  The patient is a 66 y.o. 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  He completed his prostatic radiation, which he received in conjunction with his androgen deprivation therapy. He continues to take Lupron every 3 months for his disease management.  Since his last visit, the patient has been doing okay. He denies having any new symptoms which concern him for progression of his prostate cancer.  However, his Lupron shots make him very fatigued.  He also gets hot flashes.    PHYSICAL EXAM:  Blood pressure (!) 148/104, pulse 87, temperature 98.5 F (36.9 C), resp. rate 18, height 5\' 11"  (1.803 m), weight 231 lb 6.4 oz (105 kg), SpO2 93 %. Wt Readings from Last 3 Encounters:  09/03/21 231 lb 6.4 oz (105 kg)  06/25/21 234 lb 8 oz (106.4 kg)  06/02/21 238 lb 11.2 oz (108.3 kg)   Body mass index is 32.27 kg/m. Performance status (ECOG): 1  GENERAL: The patient is alert and oriented x3, in no acute distress. HEENT EXAM: Clear oropharynx with no exudate or lesions appreciated. LUNG EXAM: Clear to auscultation bilaterally. CARDIAC EXAM: Regular rate and rhythm. No murmurs, rubs, or gallops. ABDOMINAL EXAM: Soft, nontender and nondistended; no hepatosplenomegaly; normal abdominal bowel sounds EXTREMITY EXAM: No clubbing, cyanosis, or edema. LYMPH NODE SURVEY: No palpable cervical, supraclavicular, axillary, or inguinal lymphadenopathy. NEUROLOGIC EXAM: Cranial  nerves II-XII and cerebellar functions are grossly intact.   LABS:    Latest Reference Range & Units 06/01/21 14:11 09/01/21 10:54  Prostatic Specific Antigen 0.00 - 4.00 ng/mL <0.01 <0.01    ASSESSMENT & PLAN:  Assessment/Plan:  A 66 y.o. 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  I am pleased as his PSA remains at an undetectable level.  He will continue to receive Lupron 22.5 mg injections every 3 months for the androgen deprivation management of his high risk prostate cancer, including his next dose today.  He will continue taking Lupron for his high risk disease until the Summer of 2023.  I will see him back in another 3 months for repeat clinical assessment. The patient understands all the plans discussed today and is in agreement with them.     I, Rita Ohara, am acting as scribe for Marice Potter, MD    I have reviewed this report as typed by the medical scribe, and it is complete and accurate.  Keitha Kolk [name], MD

## 2021-09-01 ENCOUNTER — Other Ambulatory Visit: Payer: Self-pay

## 2021-09-01 ENCOUNTER — Encounter: Payer: Self-pay | Admitting: Hematology and Oncology

## 2021-09-01 ENCOUNTER — Inpatient Hospital Stay: Payer: Self-pay | Attending: Oncology

## 2021-09-01 DIAGNOSIS — C61 Malignant neoplasm of prostate: Secondary | ICD-10-CM | POA: Insufficient documentation

## 2021-09-01 DIAGNOSIS — Z5111 Encounter for antineoplastic chemotherapy: Secondary | ICD-10-CM | POA: Insufficient documentation

## 2021-09-01 LAB — BASIC METABOLIC PANEL
BUN: 13 (ref 4–21)
CO2: 23 — AB (ref 13–22)
Chloride: 108 (ref 99–108)
Creatinine: 1.1 (ref 0.6–1.3)
Glucose: 132
Potassium: 4.4 (ref 3.4–5.3)
Sodium: 140 (ref 137–147)

## 2021-09-01 LAB — COMPREHENSIVE METABOLIC PANEL
Albumin: 4.1 (ref 3.5–5.0)
Calcium: 9 (ref 8.7–10.7)

## 2021-09-01 LAB — CBC AND DIFFERENTIAL
HCT: 41 (ref 41–53)
Hemoglobin: 13.9 (ref 13.5–17.5)
Neutrophils Absolute: 6.08
Platelets: 128 — AB (ref 150–399)
WBC: 8.1

## 2021-09-01 LAB — HEPATIC FUNCTION PANEL
ALT: 49 — AB (ref 10–40)
AST: 45 — AB (ref 14–40)
Alkaline Phosphatase: 82 (ref 25–125)
Bilirubin, Total: 0.6

## 2021-09-01 LAB — CBC: RBC: 4.81 (ref 3.87–5.11)

## 2021-09-01 LAB — PSA: Prostatic Specific Antigen: <0.01 ng/mL (ref 0.00–4.00)

## 2021-09-03 ENCOUNTER — Inpatient Hospital Stay (HOSPITAL_BASED_OUTPATIENT_CLINIC_OR_DEPARTMENT_OTHER): Payer: Self-pay | Admitting: Oncology

## 2021-09-03 ENCOUNTER — Other Ambulatory Visit: Payer: Self-pay | Admitting: Oncology

## 2021-09-03 ENCOUNTER — Telehealth: Payer: Self-pay | Admitting: Oncology

## 2021-09-03 ENCOUNTER — Inpatient Hospital Stay: Payer: Self-pay

## 2021-09-03 ENCOUNTER — Other Ambulatory Visit: Payer: Self-pay

## 2021-09-03 VITALS — BP 137/95 | HR 82 | Temp 98.6°F | Resp 18 | Ht 70.0 in | Wt 232.0 lb

## 2021-09-03 VITALS — BP 148/104 | HR 87 | Temp 98.5°F | Resp 18 | Ht 71.0 in | Wt 231.4 lb

## 2021-09-03 DIAGNOSIS — C61 Malignant neoplasm of prostate: Secondary | ICD-10-CM

## 2021-09-03 MED ORDER — LEUPROLIDE ACETATE (3 MONTH) 22.5 MG IM KIT
22.5000 mg | PACK | Freq: Once | INTRAMUSCULAR | Status: AC
  Administered 2021-09-03: 22.5 mg via INTRAMUSCULAR
  Filled 2021-09-03: qty 22.5

## 2021-09-03 NOTE — Telephone Encounter (Signed)
Per 09/03/21 los next appt scheduled and confirmed with patient °

## 2021-09-03 NOTE — Patient Instructions (Signed)
Leuprolide injection What is this medication? LEUPROLIDE (loo PROE lide) is a man-made hormone. It is used to treat the symptoms of prostate cancer. This medicine may also be used to treat children with early onset of puberty. It may be used for other hormonal conditions. This medicine may be used for other purposes; ask your health care provider or pharmacist if you have questions. COMMON BRAND NAME(S): Lupron What should I tell my care team before I take this medication? They need to know if you have any of these conditions: diabetes heart disease or previous heart attack high blood pressure high cholesterol pain or difficulty passing urine spinal cord metastasis stroke tobacco smoker an unusual or allergic reaction to leuprolide, benzyl alcohol, other medicines, foods, dyes, or preservatives pregnant or trying to get pregnant breast-feeding How should I use this medication? This medicine is for injection under the skin or into a muscle. You will be taught how to prepare and give this medicine. Use exactly as directed. Take your medicine at regular intervals. Do not take your medicine more often than directed. It is important that you put your used needles and syringes in a special sharps container. Do not put them in a trash can. If you do not have a sharps container, call your pharmacist or healthcare provider to get one. A special MedGuide will be given to you by the pharmacist with each prescription and refill. Be sure to read this information carefully each time. Talk to your pediatrician regarding the use of this medicine in children. While this medicine may be prescribed for children as young as 8 years for selected conditions, precautions do apply. Overdosage: If you think you have taken too much of this medicine contact a poison control center or emergency room at once. NOTE: This medicine is only for you. Do not share this medicine with others. What if I miss a dose? If you miss  a dose, take it as soon as you can. If it is almost time for your next dose, take only that dose. Do not take double or extra doses. What may interact with this medication? Do not take this medicine with any of the following medications: chasteberry cisapride dronedarone pimozide thioridazine This medicine may also interact with the following medications: herbal or dietary supplements, like black cohosh or DHEA male hormones, like estrogens or progestins and birth control pills, patches, rings, or injections male hormones, like testosterone other medicines that prolong the QT interval (abnormal heart rhythm) This list may not describe all possible interactions. Give your health care provider a list of all the medicines, herbs, non-prescription drugs, or dietary supplements you use. Also tell them if you smoke, drink alcohol, or use illegal drugs. Some items may interact with your medicine. What should I watch for while using this medication? Visit your doctor or health care professional for regular checks on your progress. During the first week, your symptoms may get worse, but then will improve as you continue your treatment. You may get hot flashes, increased bone pain, increased difficulty passing urine, or an aggravation of nerve symptoms. Discuss these effects with your doctor or health care professional, some of them may improve with continued use of this medicine. Male patients may experience a menstrual cycle or spotting during the first 2 months of therapy with this medicine. If this continues, contact your doctor or health care professional. This medicine may increase blood sugar. Ask your healthcare provider if changes in diet or medicines are needed if you have  diabetes. What side effects may I notice from receiving this medication? Side effects that you should report to your doctor or health care professional as soon as possible: allergic reactions like skin rash, itching or  hives, swelling of the face, lips, or tongue breathing problems chest pain depression or memory disorders pain in your legs or groin pain at site where injected severe headache signs and symptoms of high blood sugar such as being more thirsty or hungry or having to urinate more than normal. You may also feel very tired or have blurry vision swelling of the feet and legs visual changes vomiting Side effects that usually do not require medical attention (report to your doctor or health care professional if they continue or are bothersome): breast swelling or tenderness decrease in sex drive or performance diarrhea hot flashes loss of appetite muscle, joint, or bone pains nausea redness or irritation at site where injected skin problems or acne This list may not describe all possible side effects. Call your doctor for medical advice about side effects. You may report side effects to FDA at 1-800-FDA-1088. Where should I keep my medication? Keep out of the reach of children. Store below 25 degrees C (77 degrees F). Do not freeze. Protect from light. Do not use if it is not clear or if there are particles present. Throw away any unused medicine after the expiration date. NOTE: This sheet is a summary. It may not cover all possible information. If you have questions about this medicine, talk to your doctor, pharmacist, or health care provider.  2022 Elsevier/Gold Standard (2021-03-16 00:00:00)

## 2021-10-18 ENCOUNTER — Encounter: Payer: Self-pay | Admitting: Gastroenterology

## 2021-11-30 ENCOUNTER — Other Ambulatory Visit: Payer: Self-pay

## 2021-11-30 ENCOUNTER — Inpatient Hospital Stay: Payer: Self-pay | Attending: Oncology

## 2021-11-30 ENCOUNTER — Other Ambulatory Visit: Payer: Self-pay | Admitting: Hematology and Oncology

## 2021-11-30 DIAGNOSIS — Z5111 Encounter for antineoplastic chemotherapy: Secondary | ICD-10-CM | POA: Insufficient documentation

## 2021-11-30 DIAGNOSIS — C61 Malignant neoplasm of prostate: Secondary | ICD-10-CM | POA: Insufficient documentation

## 2021-11-30 LAB — CBC AND DIFFERENTIAL
HCT: 38 — AB (ref 41–53)
Hemoglobin: 12.3 — AB (ref 13.5–17.5)
Neutrophils Absolute: 4.71
Platelets: 116 10*3/uL — AB (ref 150–400)
WBC: 6.2

## 2021-11-30 LAB — BASIC METABOLIC PANEL
BUN: 10 (ref 4–21)
CO2: 24 — AB (ref 13–22)
Chloride: 107 (ref 99–108)
Creatinine: 1.1 (ref 0.6–1.3)
Glucose: 103
Potassium: 3.7 mEq/L (ref 3.5–5.1)
Sodium: 142 (ref 137–147)

## 2021-11-30 LAB — HEPATIC FUNCTION PANEL
ALT: 36 U/L (ref 10–40)
AST: 33 (ref 14–40)
Alkaline Phosphatase: 70 (ref 25–125)
Bilirubin, Total: 0.6

## 2021-11-30 LAB — CBC
MCV: 84 (ref 80–94)
RBC: 4.56 (ref 3.87–5.11)

## 2021-11-30 LAB — COMPREHENSIVE METABOLIC PANEL
Albumin: 3.9 (ref 3.5–5.0)
Calcium: 8.5 — AB (ref 8.7–10.7)

## 2021-11-30 NOTE — Progress Notes (Signed)
  [facility]  [address] [place],  [place]  [address] [phone]  Clinic Day:  12/01/2021  HISTORY OF PRESENT ILLNESS:  The patient is a 66 y.o. 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  He completed his prostatic radiation, which he received in conjunction with his androgen deprivation therapy. He continues to take Lupron every 3 months for his disease management.  Since his last visit, the patient has been doing okay. He denies having any new symptoms which concern him for progression of his prostate cancer.  However,  still gets hot flashes from his Lupron shots.    PHYSICAL EXAM:  Blood pressure (!) 139/109, pulse (!) 56, temperature 98.3 F (36.8 C), resp. rate 16, height '5\' 11"'$  (1.803 m), weight 230 lb 4.8 oz (104.5 kg), SpO2 99 %. Wt Readings from Last 3 Encounters:  12/01/21 230 lb 12 oz (104.7 kg)  12/01/21 230 lb 4.8 oz (104.5 kg)  09/03/21 232 lb (105.2 kg)   Body mass index is 32.12 kg/m. Performance status (ECOG): 1  GENERAL: The patient is alert and oriented x3, in no acute distress. HEENT EXAM: Clear oropharynx with no exudate or lesions appreciated. LUNG EXAM: Clear to auscultation bilaterally. CARDIAC EXAM: Regular rate and rhythm. No murmurs, rubs, or gallops. ABDOMINAL EXAM: Soft, nontender and nondistended; no hepatosplenomegaly; normal abdominal bowel sounds EXTREMITY EXAM: No clubbing, cyanosis, or edema. LYMPH NODE SURVEY: No palpable cervical, supraclavicular, axillary, or inguinal lymphadenopathy. NEUROLOGIC EXAM: Cranial nerves II-XII and cerebellar functions are grossly intact.   LABS:    Latest Reference Range & Units 11/30/21 10:53  Prostate Specific Ag, Serum 0.0 - 4.0 ng/mL <0.1    ASSESSMENT & PLAN:  Assessment/Plan:  A 66 y.o. male with high risk stage IIIA (T2c N0 M0, Gleason grade 6-7, PSA >20) prostate  cancer.  I am pleased as his PSA remains at an undetectable level.  He will receive his last Lupron 22.5 mg injection today, which will cover his androgen deprivation for 2 years with respect to his high risk disease. Clinically, he is doing well.  Moving forward, his treatment plan for now switch to surveillance, which will include his PSA level being checked at all future visits.  I will see him back in 4 months for repeat clinical assessment. The patient understands all the plans discussed today and is in agreement with them.    Nachman Sundt [name], MD

## 2021-12-01 ENCOUNTER — Other Ambulatory Visit: Payer: Self-pay | Admitting: Oncology

## 2021-12-01 ENCOUNTER — Inpatient Hospital Stay (HOSPITAL_BASED_OUTPATIENT_CLINIC_OR_DEPARTMENT_OTHER): Payer: Self-pay | Admitting: Oncology

## 2021-12-01 ENCOUNTER — Inpatient Hospital Stay: Payer: Self-pay

## 2021-12-01 VITALS — BP 139/109 | HR 56 | Temp 98.3°F | Resp 16 | Ht 71.0 in | Wt 230.3 lb

## 2021-12-01 VITALS — BP 160/95 | HR 57 | Temp 98.2°F | Resp 18 | Ht 71.0 in | Wt 230.8 lb

## 2021-12-01 DIAGNOSIS — C61 Malignant neoplasm of prostate: Secondary | ICD-10-CM

## 2021-12-01 LAB — PROSTATE-SPECIFIC AG, SERUM (LABCORP): Prostate Specific Ag, Serum: <0.1 ng/mL (ref 0.0–4.0)

## 2021-12-01 MED ORDER — LEUPROLIDE ACETATE (3 MONTH) 22.5 MG IM KIT
22.5000 mg | PACK | Freq: Once | INTRAMUSCULAR | Status: AC
  Administered 2021-12-01: 22.5 mg via INTRAMUSCULAR

## 2021-12-01 NOTE — Patient Instructions (Signed)
Leuprolide injection What is this medication? LEUPROLIDE (loo PROE lide) is a man-made hormone. It is used to treat the symptoms of prostate cancer. This medicine may also be used to treat children with early onset of puberty. It may be used for other hormonal conditions. This medicine may be used for other purposes; ask your health care provider or pharmacist if you have questions. COMMON BRAND NAME(S): Lupron What should I tell my care team before I take this medication? They need to know if you have any of these conditions: diabetes heart disease or previous heart attack high blood pressure high cholesterol pain or difficulty passing urine spinal cord metastasis stroke tobacco smoker an unusual or allergic reaction to leuprolide, benzyl alcohol, other medicines, foods, dyes, or preservatives pregnant or trying to get pregnant breast-feeding How should I use this medication? This medicine is for injection under the skin or into a muscle. You will be taught how to prepare and give this medicine. Use exactly as directed. Take your medicine at regular intervals. Do not take your medicine more often than directed. It is important that you put your used needles and syringes in a special sharps container. Do not put them in a trash can. If you do not have a sharps container, call your pharmacist or healthcare provider to get one. A special MedGuide will be given to you by the pharmacist with each prescription and refill. Be sure to read this information carefully each time. Talk to your pediatrician regarding the use of this medicine in children. While this medicine may be prescribed for children as young as 8 years for selected conditions, precautions do apply. Overdosage: If you think you have taken too much of this medicine contact a poison control center or emergency room at once. NOTE: This medicine is only for you. Do not share this medicine with others. What if I miss a dose? If you miss  a dose, take it as soon as you can. If it is almost time for your next dose, take only that dose. Do not take double or extra doses. What may interact with this medication? Do not take this medicine with any of the following medications: chasteberry cisapride dronedarone pimozide thioridazine This medicine may also interact with the following medications: herbal or dietary supplements, like black cohosh or DHEA male hormones, like estrogens or progestins and birth control pills, patches, rings, or injections male hormones, like testosterone other medicines that prolong the QT interval (abnormal heart rhythm) This list may not describe all possible interactions. Give your health care provider a list of all the medicines, herbs, non-prescription drugs, or dietary supplements you use. Also tell them if you smoke, drink alcohol, or use illegal drugs. Some items may interact with your medicine. What should I watch for while using this medication? Visit your doctor or health care professional for regular checks on your progress. During the first week, your symptoms may get worse, but then will improve as you continue your treatment. You may get hot flashes, increased bone pain, increased difficulty passing urine, or an aggravation of nerve symptoms. Discuss these effects with your doctor or health care professional, some of them may improve with continued use of this medicine. Male patients may experience a menstrual cycle or spotting during the first 2 months of therapy with this medicine. If this continues, contact your doctor or health care professional. This medicine may increase blood sugar. Ask your healthcare provider if changes in diet or medicines are needed if you have  diabetes. What side effects may I notice from receiving this medication? Side effects that you should report to your doctor or health care professional as soon as possible: allergic reactions like skin rash, itching or  hives, swelling of the face, lips, or tongue breathing problems chest pain depression or memory disorders pain in your legs or groin pain at site where injected severe headache signs and symptoms of high blood sugar such as being more thirsty or hungry or having to urinate more than normal. You may also feel very tired or have blurry vision swelling of the feet and legs visual changes vomiting Side effects that usually do not require medical attention (report to your doctor or health care professional if they continue or are bothersome): breast swelling or tenderness decrease in sex drive or performance diarrhea hot flashes loss of appetite muscle, joint, or bone pains nausea redness or irritation at site where injected skin problems or acne This list may not describe all possible side effects. Call your doctor for medical advice about side effects. You may report side effects to FDA at 1-800-FDA-1088. Where should I keep my medication? Keep out of the reach of children. Store below 25 degrees C (77 degrees F). Do not freeze. Protect from light. Do not use if it is not clear or if there are particles present. Throw away any unused medicine after the expiration date. NOTE: This sheet is a summary. It may not cover all possible information. If you have questions about this medicine, talk to your doctor, pharmacist, or health care provider.  2023 Elsevier/Gold Standard (2021-05-28 00:00:00)  

## 2021-12-02 LAB — PSA: Prostatic Specific Antigen: UNDETERMINED ng/mL (ref 0.00–4.00)

## 2021-12-05 ENCOUNTER — Encounter: Payer: Self-pay | Admitting: Hematology and Oncology

## 2022-04-04 ENCOUNTER — Inpatient Hospital Stay: Payer: Medicaid Other | Attending: Oncology

## 2022-04-04 DIAGNOSIS — Z8546 Personal history of malignant neoplasm of prostate: Secondary | ICD-10-CM | POA: Insufficient documentation

## 2022-04-04 DIAGNOSIS — C61 Malignant neoplasm of prostate: Secondary | ICD-10-CM

## 2022-04-04 DIAGNOSIS — R3911 Hesitancy of micturition: Secondary | ICD-10-CM | POA: Insufficient documentation

## 2022-04-04 LAB — PSA: Prostatic Specific Antigen: <0.01 ng/mL (ref 0.00–4.00)

## 2022-04-04 NOTE — Progress Notes (Unsigned)
Adrian Herrera  [address] [place],  [place]  [address] [phone]  Clinic Day:  04/05/2022  HISTORY OF PRESENT ILLNESS:  The patient is a 66 y.o. 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  He completed his prostatic radiation, which he received in conjunction with his androgen deprivation therapy.  As he has high risk disease, the patient received 2 years of androgen deprivation therapy, which were completed in 2023.  He comes in today to reassess his PSA level.  The patient does occasionally complain of urinary hesitancy.  He also has intermittent hot flashes.  However, the most prominent issue he has had recently is profuse rectal bleeding.  This been going on for at least the past month.  He claims he notices overt blood loss with essentially every bowel movement he has.  Of note, he has not had a GI work-up within the past 5 years.    PHYSICAL EXAM:  Blood pressure 110/89, pulse 78, temperature 98.4 F (36.9 C), resp. rate 18, height '5\' 11"'$  (1.803 m), weight 212 lb 14.4 oz (96.6 kg), SpO2 97 %. Wt Readings from Last 3 Encounters:  04/05/22 212 lb 14.4 oz (96.6 kg)  12/01/21 230 lb 12 oz (104.7 kg)  12/01/21 230 lb 4.8 oz (104.5 kg)   Body mass index is 29.69 kg/m. Performance status (ECOG): 1  GENERAL: The patient is alert and oriented x3, in no acute distress. HEENT EXAM: Clear oropharynx with no exudate or lesions appreciated. LUNG EXAM: Clear to auscultation bilaterally. CARDIAC EXAM: Regular rate and rhythm. No murmurs, rubs, or gallops. ABDOMINAL EXAM: Soft, nontender and nondistended; no hepatosplenomegaly; normal abdominal bowel sounds EXTREMITY EXAM: No clubbing, cyanosis, or edema. LYMPH NODE SURVEY: No palpable cervical, supraclavicular, axillary, or inguinal lymphadenopathy. NEUROLOGIC EXAM: Cranial nerves II-XII and cerebellar functions  are grossly intact.   LABS:    Latest Reference Range & Units 04/04/22 10:36  Prostatic Specific Antigen 0.00 - 4.00 ng/mL <0.01      Latest Reference Range & Units 04/05/22 12:38  Iron 45 - 182 ug/dL 70  UIBC ug/dL 221  TIBC 250 - 450 ug/dL 291  Saturation Ratios 17.9 - 39.5 % 24  Ferritin 24 - 336 ng/mL 149    ASSESSMENT & PLAN:  Assessment/Plan:  A 66 y.o. 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  He underwent combination therapy with androgen deprivation and prostatic radiation.  Furthermore, due to his high risk disease, he received 2 total years of adjuvant deprivation therapy.  Per his recent labs, I am pleased as his PSA remains at an undetectable level.  His PSA level will continue to be followed every 4 months to ensure he is not having any evidence of a biochemical relapse.  As mentioned previously, the most concerning issue with this gentleman has been profuse rectal bleeding.  Despite this, his hemoglobin of 12.6 today is in line with what it has been over these past few years.  Furthermore, his iron parameters are normal today.  Nevertheless, based upon his symptomology, I will refer him to GI to undergo a colonoscopy +/- an EGD.  Otherwise, I will see him back in 4 months for his continued prostate cancer surveillance.  The patient understands all the plans discussed today and is in agreement with them.    Goro Wenrick [name],  MD

## 2022-04-05 ENCOUNTER — Inpatient Hospital Stay (INDEPENDENT_AMBULATORY_CARE_PROVIDER_SITE_OTHER): Payer: Medicaid Other | Admitting: Oncology

## 2022-04-05 ENCOUNTER — Inpatient Hospital Stay: Payer: Medicaid Other

## 2022-04-05 ENCOUNTER — Other Ambulatory Visit: Payer: Self-pay | Admitting: Oncology

## 2022-04-05 VITALS — BP 110/89 | HR 78 | Temp 98.4°F | Resp 18 | Ht 71.0 in | Wt 212.9 lb

## 2022-04-05 DIAGNOSIS — C61 Malignant neoplasm of prostate: Secondary | ICD-10-CM

## 2022-04-05 DIAGNOSIS — K625 Hemorrhage of anus and rectum: Secondary | ICD-10-CM

## 2022-04-05 DIAGNOSIS — Z8546 Personal history of malignant neoplasm of prostate: Secondary | ICD-10-CM | POA: Diagnosis not present

## 2022-04-05 LAB — IRON AND TIBC
Iron: 70 ug/dL (ref 45–182)
Saturation Ratios: 24 % (ref 17.9–39.5)
TIBC: 291 ug/dL (ref 250–450)
UIBC: 221 ug/dL

## 2022-04-05 LAB — CBC AND DIFFERENTIAL
HCT: 38 — AB (ref 41–53)
Hemoglobin: 12.6 — AB (ref 13.5–17.5)
Neutrophils Absolute: 4.96
Platelets: 129 10*3/uL — AB (ref 150–400)
WBC: 6.8

## 2022-04-05 LAB — FERRITIN: Ferritin: 149 ng/mL (ref 24–336)

## 2022-04-05 LAB — CBC: RBC: 4.46 (ref 3.87–5.11)

## 2022-06-23 ENCOUNTER — Encounter: Payer: Self-pay | Admitting: Oncology

## 2022-08-04 ENCOUNTER — Other Ambulatory Visit: Payer: Medicaid Other

## 2022-08-05 ENCOUNTER — Ambulatory Visit: Payer: Medicaid Other | Admitting: Oncology

## 2022-08-11 ENCOUNTER — Inpatient Hospital Stay: Payer: Medicare Other | Attending: Oncology

## 2022-08-11 DIAGNOSIS — Z923 Personal history of irradiation: Secondary | ICD-10-CM | POA: Insufficient documentation

## 2022-08-11 DIAGNOSIS — K625 Hemorrhage of anus and rectum: Secondary | ICD-10-CM

## 2022-08-11 DIAGNOSIS — Z8546 Personal history of malignant neoplasm of prostate: Secondary | ICD-10-CM | POA: Insufficient documentation

## 2022-08-11 DIAGNOSIS — C61 Malignant neoplasm of prostate: Secondary | ICD-10-CM

## 2022-08-11 LAB — CBC WITH DIFFERENTIAL (CANCER CENTER ONLY)
Abs Immature Granulocytes: 0.02 10*3/uL (ref 0.00–0.07)
Basophils Absolute: 0 10*3/uL (ref 0.0–0.1)
Basophils Relative: 1 %
Eosinophils Absolute: 0.3 10*3/uL (ref 0.0–0.5)
Eosinophils Relative: 5 %
HCT: 38.3 % — ABNORMAL LOW (ref 39.0–52.0)
Hemoglobin: 12.7 g/dL — ABNORMAL LOW (ref 13.0–17.0)
Immature Granulocytes: 0 %
Lymphocytes Relative: 15 %
Lymphs Abs: 1.1 10*3/uL (ref 0.7–4.0)
MCH: 28.3 pg (ref 26.0–34.0)
MCHC: 33.2 g/dL (ref 30.0–36.0)
MCV: 85.3 fL (ref 80.0–100.0)
Monocytes Absolute: 0.6 10*3/uL (ref 0.1–1.0)
Monocytes Relative: 8 %
Neutro Abs: 5.2 10*3/uL (ref 1.7–7.7)
Neutrophils Relative %: 71 %
Platelet Count: 152 10*3/uL (ref 150–400)
RBC: 4.49 MIL/uL (ref 4.22–5.81)
RDW: 12.9 % (ref 11.5–15.5)
WBC Count: 7.3 10*3/uL (ref 4.0–10.5)
nRBC: 0 % (ref 0.0–0.2)

## 2022-08-11 LAB — PSA: Prostatic Specific Antigen: <0.01 ng/mL (ref 0.00–4.00)

## 2022-08-12 ENCOUNTER — Inpatient Hospital Stay (INDEPENDENT_AMBULATORY_CARE_PROVIDER_SITE_OTHER): Payer: Medicare Other | Admitting: Oncology

## 2022-08-12 ENCOUNTER — Other Ambulatory Visit: Payer: Self-pay | Admitting: Oncology

## 2022-08-12 ENCOUNTER — Telehealth: Payer: Self-pay | Admitting: Oncology

## 2022-08-12 VITALS — BP 127/91 | HR 90 | Temp 99.1°F | Resp 18 | Ht 71.0 in | Wt 205.9 lb

## 2022-08-12 DIAGNOSIS — C61 Malignant neoplasm of prostate: Secondary | ICD-10-CM

## 2022-08-12 NOTE — Telephone Encounter (Signed)
08/12/22 Next appt scheduled and confirmed with patient

## 2022-08-12 NOTE — Progress Notes (Unsigned)
  Adrian Herrera  [address] [place],  [place]  [address] [phone]  Clinic Day:  08/12/2022  HISTORY OF PRESENT ILLNESS:  The patient is a 67 y.o. 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  He completed his prostatic radiation, which he received in conjunction with his androgen deprivation therapy.  As he has high risk disease, the patient received 2 years of androgen deprivation therapy, which were completed in 2023.  He comes in today to reassess his PSA level.  Since his last visit, the patient has been doing okay.  He denies having as many hot flashes as he did previously.  He also denies having any new symptoms or findings which concern him for disease recurrence.   PHYSICAL EXAM:  Blood pressure (!) 127/91, pulse 90, temperature 99.1 F (37.3 C), resp. rate 18, height '5\' 11"'$  (1.803 m), weight 205 lb 14.4 oz (93.4 kg), SpO2 99 %. Wt Readings from Last 3 Encounters:  08/12/22 205 lb 14.4 oz (93.4 kg)  04/05/22 212 lb 14.4 oz (96.6 kg)  12/01/21 230 lb 12 oz (104.7 kg)   Body mass index is 28.72 kg/m. Performance status (ECOG): 1  GENERAL: The patient is alert and oriented x3, in no acute distress. HEENT EXAM: Clear oropharynx with no exudate or lesions appreciated. LUNG EXAM: Clear to auscultation bilaterally. CARDIAC EXAM: Regular rate and rhythm. No murmurs, rubs, or gallops. ABDOMINAL EXAM: Soft, nontender and nondistended; no hepatosplenomegaly; normal abdominal bowel sounds EXTREMITY EXAM: No clubbing, cyanosis, or edema. LYMPH NODE SURVEY: No palpable cervical, supraclavicular, axillary, or inguinal lymphadenopathy. NEUROLOGIC EXAM: Cranial nerves II-XII and cerebellar functions are grossly intact.   LABS:    Latest Reference Range & Units 08/11/22 11:03  Prostatic Specific Antigen 0.00 - 4.00 ng/mL <0.01    ASSESSMENT & PLAN:   Assessment/Plan:  A 67 y.o. 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  He underwent combination therapy with androgen deprivation and prostatic radiation.  Furthermore, due to his high risk disease, he received 2 total years of adjuvant deprivation therapy.  Per his recent labs, I am pleased as his PSA remains at an undetectable level.  His PSA level will continue to be followed every 4 months to ensure he is not having any evidence of a biochemical relapse.  The patient understands all the plans discussed today and is in agreement with them.    [name], MD

## 2022-10-26 ENCOUNTER — Encounter: Payer: Self-pay | Admitting: Podiatry

## 2022-10-26 ENCOUNTER — Ambulatory Visit (INDEPENDENT_AMBULATORY_CARE_PROVIDER_SITE_OTHER): Payer: 59 | Admitting: Podiatry

## 2022-10-26 DIAGNOSIS — L84 Corns and callosities: Secondary | ICD-10-CM | POA: Diagnosis not present

## 2022-10-26 DIAGNOSIS — G629 Polyneuropathy, unspecified: Secondary | ICD-10-CM | POA: Diagnosis not present

## 2022-10-26 NOTE — Progress Notes (Signed)
  Subjective:  Patient ID: Adrian Herrera, male    DOB: 05-27-1956,   MRN: [id number]  Chief Complaint  Patient presents with   Callouses    bil painful callus - neuropathy, callouse located at the ball of foot near the 4th and 5th digits. Patient has darkening of the skin above the left ankle, patient nails growing irregular     67 y.o. male presents for concern of bilateral calluses and concern for neuropathy. He has a history of prostate cancer and relates radiation has caused him "some problems". He states he has been trying to trim the calluses himself and PCP recommended he be seen with [place] for trimming. He is pre-diabetic. Marland Kitchen   PCP:  Jim Like, NP    . Denies any other pedal complaints. Denies n/v/f/c.   Past Medical History:  Diagnosis Date   BPH with elevated PSA    Complication of anesthesia    " i HAD A COMPLICATION AT [facility] , BUT I RECALL WHAT IT WAS "   Dyslipidemia    GERD (gastroesophageal reflux disease)    Hypertension    Malignant neoplasm of prostate    Malignant neoplasm of prostate    Neuropathy    Prostate cancer    Tinnitus     Objective:  Physical Exam: Vascular: DP/PT pulses 2/4 bilateral. CFT <3 seconds. Normal hair growth on digits. No edema.  Skin. No lacerations or abrasions bilateral feet. Hyperkeratotic tissue noted to distal fourth metatarsal on right and plantar third metatarsal on left.  Musculoskeletal: MMT 5/5 bilateral lower extremities in DF, PF, Inversion and Eversion. Deceased ROM in DF of ankle joint.  Neurological: Sensation intact to light touch.   Assessment:   1. Callus of foot   2. Neuropathy      Plan:  Patient was evaluated and treated and all questions answered. -Discussed corns and calluses with patient and treatment options.  -Hyperkeratotic tissue was debrided with chisel without incident.  -Encouraged daily moisturizing -Discussed use of pumice stone -Advised good supportive shoes and inserts -Discussed and  educated patient on diabetic foot care, especially with  regards to the vascular, neurological and musculoskeletal systems.  -Patient to return  in 3 months for at risk foot care -Patient advised to call the office if any problems or questions arise in the meantime.    Louann Sjogren, DPM

## 2022-12-09 ENCOUNTER — Other Ambulatory Visit: Payer: Medicare Other

## 2022-12-12 ENCOUNTER — Ambulatory Visit: Payer: Medicare Other | Admitting: Oncology

## 2022-12-16 ENCOUNTER — Inpatient Hospital Stay: Payer: 59 | Attending: Oncology

## 2022-12-16 DIAGNOSIS — Z8546 Personal history of malignant neoplasm of prostate: Secondary | ICD-10-CM | POA: Diagnosis not present

## 2022-12-16 DIAGNOSIS — C61 Malignant neoplasm of prostate: Secondary | ICD-10-CM

## 2022-12-16 LAB — PSA: Prostatic Specific Antigen: 0.02 ng/mL (ref 0.00–4.00)

## 2022-12-18 NOTE — Progress Notes (Unsigned)
  [facility]  [address] [place],  [place]  [address] [phone]  Clinic Day:  12/19/2022  HISTORY OF PRESENT ILLNESS:  The patient is a 67 y.o. 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  He completed his prostatic radiation, which he received in conjunction with his androgen deprivation therapy.  As he had high risk disease, the patient received 2 years of androgen deprivation therapy, which were completed in 2023.  He comes in today to reassess his PSA level.  Since his last visit, the patient has been doing okay.  He denies having as many hot flashes as he did previously.  He denies having any new symptoms or findings which concern him for disease recurrence.  His major issue today is neck and back pain, which is been chronic in nature.  He does not believe the pain in these areas is related to his prostate cancer.  PHYSICAL EXAM:  Blood pressure (!) 154/69, pulse 85, temperature 98.4 F (36.9 C), resp. rate 16, height 5\' 11"  (1.803 m), weight 209 lb 4.8 oz (94.9 kg), SpO2 100 %. Wt Readings from Last 3 Encounters:  12/19/22 209 lb 4.8 oz (94.9 kg)  08/12/22 205 lb 14.4 oz (93.4 kg)  04/05/22 212 lb 14.4 oz (96.6 kg)   Body mass index is 29.19 kg/m. Performance status (ECOG): 1  GENERAL: The patient is alert and oriented x3, in no acute distress. HEENT EXAM: Clear oropharynx with no exudate or lesions appreciated. LUNG EXAM: Clear to auscultation bilaterally. CARDIAC EXAM: Regular rate and rhythm. No murmurs, rubs, or gallops. ABDOMINAL EXAM: Soft, nontender and nondistended; no hepatosplenomegaly; normal abdominal bowel sounds EXTREMITY EXAM: No clubbing, cyanosis, or edema. LYMPH NODE SURVEY: No palpable cervical, supraclavicular, axillary, or inguinal lymphadenopathy. NEUROLOGIC EXAM: Cranial nerves II-XII and cerebellar functions are grossly  intact.   LABS:    Latest Reference Range & Units 08/11/22 11:03 12/16/22 14:09  Prostatic Specific Antigen 0.00 - 4.00 ng/mL <0.01 0.02    ASSESSMENT & PLAN:  Assessment/Plan:  A 67 y.o. 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  He underwent combination therapy with androgen deprivation and prostatic radiation.  Furthermore, due to his high risk disease, he received 2 total years of adjuvant deprivation therapy.  Per his recent labs, I am pleased as his PSA remains at an undetectable (<0.1) level.  His PSA level will continue to be followed every 4 months to ensure he is not having any evidence of a biochemical relapse.  Will respect to his neck and back pain, he was prescribed tramadol 50-100 mg every 6 hours to take as needed.  I will also place a referral for him to be seen by pain clinic.  Otherwise, I will see this patient back in October 2024 for repeat clinical assessment.  The patient understands all the plans discussed today and is in agreement with them.    Lenardo Westwood Kirby Funk, MD

## 2022-12-19 ENCOUNTER — Other Ambulatory Visit: Payer: Self-pay | Admitting: Oncology

## 2022-12-19 ENCOUNTER — Inpatient Hospital Stay (INDEPENDENT_AMBULATORY_CARE_PROVIDER_SITE_OTHER): Payer: 59 | Admitting: Oncology

## 2022-12-19 VITALS — BP 154/69 | HR 85 | Temp 98.4°F | Resp 16 | Ht 71.0 in | Wt 209.3 lb

## 2022-12-19 DIAGNOSIS — C61 Malignant neoplasm of prostate: Secondary | ICD-10-CM

## 2022-12-19 MED ORDER — TRAMADOL HCL 50 MG PO TABS: ORAL_TABLET | ORAL | 0 refills | Status: AC

## 2022-12-20 ENCOUNTER — Telehealth: Payer: Self-pay | Admitting: Oncology

## 2022-12-20 NOTE — Telephone Encounter (Signed)
12/20/22 Spoke with patient and scheduled next appt

## 2023-01-09 DIAGNOSIS — L255 Unspecified contact dermatitis due to plants, except food: Secondary | ICD-10-CM | POA: Diagnosis not present

## 2023-01-25 ENCOUNTER — Encounter: Payer: 59 | Admitting: Podiatry

## 2023-02-21 DIAGNOSIS — E785 Hyperlipidemia, unspecified: Secondary | ICD-10-CM | POA: Diagnosis not present

## 2023-02-21 DIAGNOSIS — G8929 Other chronic pain: Secondary | ICD-10-CM | POA: Diagnosis not present

## 2023-02-21 DIAGNOSIS — G629 Polyneuropathy, unspecified: Secondary | ICD-10-CM | POA: Diagnosis not present

## 2023-02-21 DIAGNOSIS — R252 Cramp and spasm: Secondary | ICD-10-CM | POA: Diagnosis not present

## 2023-02-21 DIAGNOSIS — M549 Dorsalgia, unspecified: Secondary | ICD-10-CM | POA: Diagnosis not present

## 2023-04-18 ENCOUNTER — Ambulatory Visit (INDEPENDENT_AMBULATORY_CARE_PROVIDER_SITE_OTHER): Payer: 59 | Admitting: Podiatry

## 2023-04-18 ENCOUNTER — Encounter: Payer: Self-pay | Admitting: Podiatry

## 2023-04-18 DIAGNOSIS — B351 Tinea unguium: Secondary | ICD-10-CM | POA: Diagnosis not present

## 2023-04-18 DIAGNOSIS — M79675 Pain in left toe(s): Secondary | ICD-10-CM

## 2023-04-18 DIAGNOSIS — L84 Corns and callosities: Secondary | ICD-10-CM | POA: Diagnosis not present

## 2023-04-18 DIAGNOSIS — M79674 Pain in right toe(s): Secondary | ICD-10-CM | POA: Diagnosis not present

## 2023-04-18 DIAGNOSIS — G629 Polyneuropathy, unspecified: Secondary | ICD-10-CM | POA: Diagnosis not present

## 2023-04-18 NOTE — Progress Notes (Signed)
Subjective:  Patient ID: Adrian Herrera, male    DOB: 13-Apr-1956,  MRN: [id number]  Chief Complaint  Patient presents with   Routine Foot Care    Nails and plantar 4th/3rd met head calluses b/l. Pre-dm. Neuropathy pain on gabapentin. Left 3rd toenail lifts up and is painful    67 y.o. male presents with the above complaint. History confirmed with patient. Patient presenting with pain related to dystrophic thickened elongated nails. Patient is unable to trim own nails related to nail dystrophy and/or mobility issues. Patient does not have a history of T2DM. Patient does  have callus present located at the bilateral plantar forefoot causing pain. He does have neuropathy.  Objective:  Physical Exam: warm, good capillary refill nail exam onychomycosis of the toenails, onycholysis, and dystrophic nails DP pulses palpable, PT pulses palpable, and protective sensation absent Left Foot:  Pain with palpation of nails due to elongation and dystrophic growth.  Large preulcerative hyperkeratotic lesion plantar second metatarsal head on the Right Foot: Pain with palpation of nails due to elongation and dystrophic growth.  Large preulcerative hyperkeratotic lesion plantar third metatarsal head on the right foot  Assessment:   1. Callus of foot   2. Pain due to onychomycosis of toenails of both feet   3. Neuropathy      Plan:  Patient was evaluated and treated and all questions answered.  #Hyperkeratotic lesions/pre ulcerative calluses present bilateral plantar forefoot # Idiopathic peripheral neuropathy All symptomatic hyperkeratoses x 2 separate lesions were safely debrided with a sterile #10 blade to patient's level of comfort without incident. We discussed preventative and palliative care of these lesions including supportive and accommodative shoegear, padding, prefabricated and custom molded accommodative orthoses, use of a pumice stone and lotions/creams daily.  #Onychomycosis with pain   -Nails palliatively debrided as below. -Educated on self-care  Procedure: Nail Debridement Rationale: Pain Type of Debridement: manual, sharp debridement. Instrumentation: Nail nipper, rotary burr. Number of Nails: 10  Return in about 3 months (around 07/19/2023) for RFC.         Corinna Gab, DPM [facility]

## 2023-04-19 ENCOUNTER — Inpatient Hospital Stay: Payer: 59 | Attending: Oncology

## 2023-04-19 DIAGNOSIS — Z923 Personal history of irradiation: Secondary | ICD-10-CM | POA: Diagnosis not present

## 2023-04-19 DIAGNOSIS — Z8546 Personal history of malignant neoplasm of prostate: Secondary | ICD-10-CM | POA: Diagnosis present

## 2023-04-19 LAB — CBC WITH DIFFERENTIAL (CANCER CENTER ONLY)
Abs Immature Granulocytes: 0.02 10*3/uL (ref 0.00–0.07)
Basophils Absolute: 0.1 10*3/uL (ref 0.0–0.1)
Basophils Relative: 1 %
Eosinophils Absolute: 0.3 10*3/uL (ref 0.0–0.5)
Eosinophils Relative: 4 %
HCT: 39.2 % (ref 39.0–52.0)
Hemoglobin: 12.8 g/dL — ABNORMAL LOW (ref 13.0–17.0)
Immature Granulocytes: 0 %
Lymphocytes Relative: 13 %
Lymphs Abs: 0.9 10*3/uL (ref 0.7–4.0)
MCH: 29 pg (ref 26.0–34.0)
MCHC: 32.7 g/dL (ref 30.0–36.0)
MCV: 88.9 fL (ref 80.0–100.0)
Monocytes Absolute: 0.5 10*3/uL (ref 0.1–1.0)
Monocytes Relative: 7 %
Neutro Abs: 5.1 10*3/uL (ref 1.7–7.7)
Neutrophils Relative %: 75 %
Platelet Count: 147 10*3/uL — ABNORMAL LOW (ref 150–400)
RBC: 4.41 MIL/uL (ref 4.22–5.81)
RDW: 13.3 % (ref 11.5–15.5)
WBC Count: 6.9 10*3/uL (ref 4.0–10.5)
nRBC: 0 % (ref 0.0–0.2)

## 2023-04-19 LAB — PSA: Prostatic Specific Antigen: 0.03 ng/mL (ref 0.00–4.00)

## 2023-04-20 NOTE — Progress Notes (Signed)
  [facility]  [address] [place],  [place]  [address] [phone]  Clinic Day:  04/21/2023  HISTORY OF PRESENT ILLNESS:  The patient is a 67 y.o. 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  He completed his prostatic radiation, which he received in conjunction with his androgen deprivation therapy.  As he had high risk disease, the patient received 2 years of androgen deprivation therapy, which were completed in 2023.  He comes in today to reassess his PSA level.  Since his last visit, the patient has been doing okay.  He denies having any new symptoms or findings which concern him for disease recurrence.  He has chronic back pain unrelated to his prostate cancer for which he is scheduled to see pain management later this month.  PHYSICAL EXAM:  Blood pressure (!) 158/75, pulse 72, temperature 98.6 F (37 C), resp. rate 16, height 5\' 11"  (1.803 m), weight 208 lb 14.4 oz (94.8 kg), SpO2 100%. Wt Readings from Last 3 Encounters:  04/21/23 208 lb 14.4 oz (94.8 kg)  12/19/22 209 lb 4.8 oz (94.9 kg)  08/12/22 205 lb 14.4 oz (93.4 kg)   Body mass index is 29.14 kg/m. Performance status (ECOG): 1 GENERAL: The patient is alert and oriented x3, in no acute distress. HEENT EXAM: Clear oropharynx with no exudate or lesions appreciated. LUNG EXAM: Clear to auscultation bilaterally. CARDIAC EXAM: Regular rate and rhythm. No murmurs, rubs, or gallops. ABDOMINAL EXAM: Soft, nontender and nondistended; no hepatosplenomegaly; normal abdominal bowel sounds EXTREMITY EXAM: No clubbing, cyanosis, or edema. LYMPH NODE SURVEY: No palpable cervical, supraclavicular, axillary, or inguinal lymphadenopathy. NEUROLOGIC EXAM: Cranial nerves II-XII and cerebellar functions are grossly intact.   LABS:    Latest Reference Range & Units 08/11/22 11:03 12/16/22 14:09 04/19/23 13:40   Prostatic Specific Antigen 0.00 - 4.00 ng/mL <0.01 0.02 0.03    ASSESSMENT & PLAN:  Assessment/Plan:  A 67 y.o. 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  He underwent combination therapy with androgen deprivation and prostatic radiation.  Furthermore, due to his high risk disease, he received 2 total years of adjuvant deprivation therapy.  Per his recent labs, I am pleased as his PSA still qualifies at being at an undetectable (<0.1) level.  Overall, he appears to be doing okay.  I will see this patient back in 6 months for repeat clinical assessment.  The patient understands all the plans discussed today and is in agreement with them.    Honorio Devol Kirby Funk, MD

## 2023-04-21 ENCOUNTER — Other Ambulatory Visit: Payer: Self-pay | Admitting: Oncology

## 2023-04-21 ENCOUNTER — Inpatient Hospital Stay (INDEPENDENT_AMBULATORY_CARE_PROVIDER_SITE_OTHER): Payer: 59 | Admitting: Oncology

## 2023-04-21 VITALS — BP 158/75 | HR 72 | Temp 98.6°F | Resp 16 | Ht 71.0 in | Wt 208.9 lb

## 2023-04-21 DIAGNOSIS — C61 Malignant neoplasm of prostate: Secondary | ICD-10-CM

## 2023-04-26 ENCOUNTER — Telehealth: Payer: Self-pay | Admitting: Oncology

## 2023-04-26 NOTE — Telephone Encounter (Signed)
Patient has been scheduled. Aware of appt date and time.   Scheduling Message Entered by Rennis Harding A on 04/21/2023 at  6:18 PM Priority: Routine <No visit type provided>  Department: [facility]  Provider:  Scheduling Notes:  Labs 10-19-23  Appt 10-20-23

## 2023-06-27 DIAGNOSIS — I739 Peripheral vascular disease, unspecified: Secondary | ICD-10-CM | POA: Diagnosis not present

## 2023-06-27 DIAGNOSIS — Z23 Encounter for immunization: Secondary | ICD-10-CM | POA: Diagnosis not present

## 2023-06-27 DIAGNOSIS — K219 Gastro-esophageal reflux disease without esophagitis: Secondary | ICD-10-CM | POA: Diagnosis not present

## 2023-06-27 DIAGNOSIS — E785 Hyperlipidemia, unspecified: Secondary | ICD-10-CM | POA: Diagnosis not present

## 2023-06-27 DIAGNOSIS — G629 Polyneuropathy, unspecified: Secondary | ICD-10-CM | POA: Diagnosis not present

## 2023-06-27 DIAGNOSIS — R7301 Impaired fasting glucose: Secondary | ICD-10-CM | POA: Diagnosis not present

## 2023-07-18 ENCOUNTER — Ambulatory Visit (INDEPENDENT_AMBULATORY_CARE_PROVIDER_SITE_OTHER): Payer: 59 | Admitting: Podiatry

## 2023-07-18 DIAGNOSIS — I739 Peripheral vascular disease, unspecified: Secondary | ICD-10-CM | POA: Diagnosis not present

## 2023-07-18 DIAGNOSIS — M79674 Pain in right toe(s): Secondary | ICD-10-CM

## 2023-07-18 DIAGNOSIS — M216X9 Other acquired deformities of unspecified foot: Secondary | ICD-10-CM

## 2023-07-18 DIAGNOSIS — M79675 Pain in left toe(s): Secondary | ICD-10-CM | POA: Diagnosis not present

## 2023-07-18 DIAGNOSIS — I872 Venous insufficiency (chronic) (peripheral): Secondary | ICD-10-CM

## 2023-07-18 DIAGNOSIS — G629 Polyneuropathy, unspecified: Secondary | ICD-10-CM | POA: Diagnosis not present

## 2023-07-18 DIAGNOSIS — B351 Tinea unguium: Secondary | ICD-10-CM | POA: Diagnosis not present

## 2023-07-18 DIAGNOSIS — L97512 Non-pressure chronic ulcer of other part of right foot with fat layer exposed: Secondary | ICD-10-CM

## 2023-07-18 NOTE — Patient Instructions (Addendum)
 More silicone pads can be purchased from:  [email or web address]   [employer]  I recommend looking for horseshoe pads or metatarsal pads.  These can also be found on [employer] or at some drugstores.  Monitor right foot wound for signs of infection.  This can include worsening pain, redness, swelling, drainage.  Keep covered with Band-Aid.  May use antibiotic ointment for 5 to 7 days.  Limit weightbearing activities much as possible to the right foot

## 2023-07-18 NOTE — Progress Notes (Signed)
 Chief Complaint  Patient presents with   RFC    Pt is here for RFC, but has bilateral foot pain. Does have an apt with Vascular on Feb 5. Feels like foot is spreading out, Worse in the last 3  months. He has neuropathy but states that is a different type of pain. Takes Gabapent.     HPI: 68 y.o. male presents today primarily for routine footcare.  He does have very painful preulcerative calluses with dried blood blister to the right foot.  He also comes in for painful, thickened, elongated, dystrophic toenails that he is unable to maintain himself due to their dystrophic nature and they are painful shoe gear.  He does have neuropathy.  He denies diabetes.  Past Medical History:  Diagnosis Date   BPH with elevated PSA    Complication of anesthesia     i HAD A COMPLICATION AT [facility] , BUT I RECALL WHAT IT WAS    Dyslipidemia    GERD (gastroesophageal reflux disease)    Hypertension    Malignant neoplasm of prostate (HCC)    Malignant neoplasm of prostate (HCC)    Neuropathy    Prostate cancer (HCC)    Tinnitus     Past Surgical History:  Procedure Laterality Date   FACIAL FRACTURE SURGERY     I run into a crow bar about 4 times    No Known Allergies  ROS denies nausea, vomiting, fever, chills, chest pain, shortness of breath   Physical Exam: There were no vitals filed for this visit.  General: The patient is alert and oriented x3 in no acute distress.  Dermatology: Painful hyperkeratotic lesion sub-3rd-4th metatarsal head regions bilaterally.  Left foot preulcerative.  Right foot dried blood blister present.  Upon debridement of this lesion there is underlying ulceration present with fat layer exposed approximately 0.5 cm in diameter.  No surrounding erythema.  Lesions are tender on palpation.  Brawny skin changes present with hemosiderin deposits to the medial ankle suggestive of venous insufficiency and chronic vascular disease  Vascular: Palpable DP pulses,  faintly palpable PT pulses. Capillary refill 3 to 5 seconds to the digits.  +1 pitting edema bilaterally.  Telangiectasias and tortuous veins appreciated to lower extremities bilaterally.  Neurological: Protective sensation absent  Musculoskeletal Exam: High arched foot type appreciated, pes cavus.  Radiographic Exam:  Normal osseous mineralization. Joint spaces preserved.  No fractures or osseous irregularities noted.  Assessment/Plan of Care: 1. 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 Cavus deformity of foot, acquired   3. PAD (peripheral artery disease) (HCC)   4. Pain due to onychomycosis of toenails of both feet   5. Venous (peripheral) insufficiency      No orders of the defined types were placed in this encounter.  VAS US  ABI WITH/WO TBI  Discussed clinical findings with patient today.  # Preulcerative callus left foot, ulceration to right foot with fat layer exposed # Neuropathy # Cavus foot deformity # Concern for PAD- possibly mixed vascular disease with venous insufficiency -All symptomatic hyperkeratoses x 2 were sharply debrided with a sterile # 312 blade, no ulceration present to the left foot for this we discussed preventative and palliative care of these lesions including supportive and accommodative shoegear, padding, prefabricated and custom molded accommodative orthoses, use of a pumice stone and lotions/creams daily.  -Underlying ulceration at the site of blood blister present to the right foot, nonviable hyperkeratotic ptotic tissue  and devitalized tissue was excisionally debrided including skin subcutaneous tissue.  Hemostasis achieved with compression.  Postdebridement measurements 0.5 x 0.5 x 0.3 cm. -Neosporin and bandage applied bilaterally as the left foot was also preulcerative.  Horseshoe pads applied. -Emphasized need for limited stance and gait to the right foot.  Surgical shoe dispensed, to be worn whenever out of bed -Discussed  local wound care instructions and to continue with accommodative padding -High arched foot type and idiopathic neuropathy likely contributing to the etiology of the wound and preulcerative callus.  # Concern for PAD # Venous insufficiency -Concern for underlying vascular disease.  ABI ordered.  They state that they are seeing vein specialist upcoming as wife has been concerned with patient swelling and varicose veins.  Do think that mixed vascular disease is quite possible in this instance -Did discuss compression stockings for the venous insufficiency until they can be evaluated by vascular specialist however defer this due to concern for concern for arterial sufficiency -I certify that this diagnosis represents a distinct and separate diagnosis that requires evaluation and treatment separate from other procedures or diagnosis    # Pain due to onychomycosis of toenails - Nails x 10 were palliatively debrided in thickness and length using aseptic nail nippers without incident. -Filed thin using mechanical bur  Return in 2 weeks for local wound care   Kester Stimpson L. Lamount MAUL, AACFAS [facility]     [address] [place], [place] [address]                Office [phone]  Fax [phone]

## 2023-07-24 DIAGNOSIS — Z9181 History of falling: Secondary | ICD-10-CM | POA: Diagnosis not present

## 2023-07-24 DIAGNOSIS — Z139 Encounter for screening, unspecified: Secondary | ICD-10-CM | POA: Diagnosis not present

## 2023-07-24 DIAGNOSIS — Z0000 Encounter for general adult medical examination without abnormal findings: Secondary | ICD-10-CM | POA: Diagnosis not present

## 2023-08-01 ENCOUNTER — Encounter: Payer: Self-pay | Admitting: Podiatry

## 2023-08-01 ENCOUNTER — Ambulatory Visit (INDEPENDENT_AMBULATORY_CARE_PROVIDER_SITE_OTHER): Payer: 59 | Admitting: Podiatry

## 2023-08-01 DIAGNOSIS — M7742 Metatarsalgia, left foot: Secondary | ICD-10-CM

## 2023-08-01 DIAGNOSIS — M7741 Metatarsalgia, right foot: Secondary | ICD-10-CM

## 2023-08-01 DIAGNOSIS — L84 Corns and callosities: Secondary | ICD-10-CM | POA: Diagnosis not present

## 2023-08-01 DIAGNOSIS — M216X9 Other acquired deformities of unspecified foot: Secondary | ICD-10-CM | POA: Diagnosis not present

## 2023-08-01 NOTE — Progress Notes (Unsigned)
Chief Complaint  Patient presents with   Wound Check    Right foot ulcer, looks closed, but he states it is still painful. There is still a dark spot, takes ASA 81    HPI: 68 y.o. male presents today for follow-up evaluation of right plantar foot wound.  It appears well-healed today.  Does have scab formation present.  The area is still very painful for the patient.  He does have neuropathy and usually comes in for routine footcare.  He is requesting inserts for shoes.  Past Medical History:  Diagnosis Date   BPH with elevated PSA    Complication of anesthesia    " i HAD A COMPLICATION AT [facility] , BUT I RECALL WHAT IT WAS "   Dyslipidemia    GERD (gastroesophageal reflux disease)    Hypertension    Malignant neoplasm of prostate (HCC)    Malignant neoplasm of prostate (HCC)    Neuropathy    Prostate cancer (HCC)    Tinnitus     Past Surgical History:  Procedure Laterality Date   FACIAL FRACTURE SURGERY     "I run into a crow bar about 4 times"    No Known Allergies  ROS denies nausea, vomiting, fever, chills, chest pain, shortness of breath   Physical Exam: There were no vitals filed for this visit.  General: The patient is alert and oriented x3 in no acute distress.  Dermatology: Painful hyperkeratotic lesion sub-3rd-4th metatarsal head regions bilaterally.  Site of right foot ulceration appears well-healed at this point.  There is still some dried heme present distal aspect, upon sharp debridement, wound appears well healed.  This area is tender on palpation.  Brawny skin changes present with hemosiderin deposits to the medial ankle suggestive of venous insufficiency and chronic vascular disease  Vascular: Palpable DP pulses, palpable PT pulses. Capillary refill 3 to 5 seconds to the digits.  +1 pitting edema bilaterally.  Telangiectasias and tortuous veins appreciated to lower extremities bilaterally.  Neurological: Protective sensation  absent  Musculoskeletal Exam: High arched foot type appreciated, pes cavus.   Assessment/Plan of Care: 1. Cavus deformity of foot, acquired   2. Pre-ulcerative calluses   3. Metatarsalgia of both feet      No orders of the defined types were placed in this encounter.  FOR HOME USE ONLY DME CUSTOM ORTHOTICS  Discussed clinical findings with patient today.  # Preulcerative callus bilateral subfourth metatarsal head region bilaterally -Right plantar forefoot callus safely debrided as a courtesy with a sterile #15 blade to patient's level of comfort without incident. We discussed preventative and palliative care of these lesions including supportive and accommodative shoegear, padding, prefabricated and custom molded accommodative orthoses, use of a pumice stone and lotions/creams daily.  -Horseshoe pad applied - Patient may return to regular shoe gear.  Watch this area closely for any signs of recurrence of the ulceration  # Cavus foot deformity # Metatarsalgia both feet -Custom orthotics ordered for the patient with offloading of the subfourth metatarsal head regions bilaterally.  Return to clinic in about 2 months for nail care, or sooner if new pedal complaints arise  Aranda Bihm L. Marchia Bond, AACFAS [facility]     [address]  [place], [place] [address]                Office [phone]  Fax [phone]

## 2023-08-01 NOTE — Patient Instructions (Signed)
Look for urea 40% cream or ointment and apply to the thickened dry skin / calluses. This can be bought over the counter, at a pharmacy or online such as [employer].  May also find product that is mixed with salicylic acid.  Scrubbing white vinegar may also help soften of the calluses.  Can use this before showering.  May file callus down with pumice stone or emery board as needed.  More silicone for felt pads can be purchased from:  [email or web address]   Metatarsal pad and horseshoe pad dispensed today

## 2023-08-08 ENCOUNTER — Ambulatory Visit: Payer: 59 | Attending: Podiatry

## 2023-08-08 DIAGNOSIS — L97512 Non-pressure chronic ulcer of other part of right foot with fat layer exposed: Secondary | ICD-10-CM | POA: Diagnosis not present

## 2023-08-08 DIAGNOSIS — I739 Peripheral vascular disease, unspecified: Secondary | ICD-10-CM

## 2023-08-08 LAB — VAS US ABI WITH/WO TBI
Left ABI: 1.15
Right ABI: 1.12

## 2023-08-16 DIAGNOSIS — I8390 Asymptomatic varicose veins of unspecified lower extremity: Secondary | ICD-10-CM | POA: Diagnosis not present

## 2023-08-16 DIAGNOSIS — R600 Localized edema: Secondary | ICD-10-CM | POA: Diagnosis not present

## 2023-08-16 DIAGNOSIS — G629 Polyneuropathy, unspecified: Secondary | ICD-10-CM | POA: Diagnosis not present

## 2023-10-03 ENCOUNTER — Ambulatory Visit (INDEPENDENT_AMBULATORY_CARE_PROVIDER_SITE_OTHER): Payer: 59 | Admitting: Podiatry

## 2023-10-03 ENCOUNTER — Encounter: Payer: Self-pay | Admitting: Podiatry

## 2023-10-03 DIAGNOSIS — B351 Tinea unguium: Secondary | ICD-10-CM | POA: Diagnosis not present

## 2023-10-03 DIAGNOSIS — L97512 Non-pressure chronic ulcer of other part of right foot with fat layer exposed: Secondary | ICD-10-CM | POA: Diagnosis not present

## 2023-10-03 DIAGNOSIS — M79674 Pain in right toe(s): Secondary | ICD-10-CM

## 2023-10-03 DIAGNOSIS — M216X9 Other acquired deformities of unspecified foot: Secondary | ICD-10-CM | POA: Diagnosis not present

## 2023-10-03 DIAGNOSIS — M79675 Pain in left toe(s): Secondary | ICD-10-CM

## 2023-10-03 DIAGNOSIS — M7741 Metatarsalgia, right foot: Secondary | ICD-10-CM

## 2023-10-03 DIAGNOSIS — M7742 Metatarsalgia, left foot: Secondary | ICD-10-CM

## 2023-10-03 MED ORDER — MUPIROCIN 2 % EX OINT: 1.0000 | TOPICAL_OINTMENT | Freq: Two times a day (BID) | CUTANEOUS | 0 refills | Status: AC

## 2023-10-03 NOTE — Progress Notes (Unsigned)
 Chief Complaint  Patient presents with   RFC    RFC with callous. He his still having sever neuropathy pain. He is taking Gabapent 300 Q 8Hrs, but is not getting relief. Not diabetic, and takes ASA 81    HPI: 68 y.o. male presents today treatment of painful preulcerative calluses to both feet subfourth metatarsal head region.  These have become pretty painful for him since the last time he was seen limits his weightbearing activity.  He does state that he is going to get custom shoes made.  Past Medical History:  Diagnosis Date   BPH with elevated PSA    Complication of anesthesia    " i HAD A COMPLICATION AT [facility] , BUT I RECALL WHAT IT WAS "   Dyslipidemia    GERD (gastroesophageal reflux disease)    Hypertension    Malignant neoplasm of prostate (HCC)    Malignant neoplasm of prostate (HCC)    Neuropathy    Prostate cancer (HCC)    Tinnitus     Past Surgical History:  Procedure Laterality Date   FACIAL FRACTURE SURGERY     "I run into a crow bar about 4 times"    No Known Allergies  ROS denies nausea, vomiting, fever, chills, chest pain, shortness of breath   Physical Exam: There were no vitals filed for this visit.  General: The patient is alert and oriented x3 in no acute distress.  Dermatology: Painful preulcerative hyperkeratotic lesion 4th metatarsal head regions bilaterally.  There is slight area of full-thickness skin breakdown with fat layer exposed on the right foot less than 0.5 cm in diameter.  No signs of infection noted.  The left foot lesion there is partial-thickness breakdown of skin as well, minimal tissue remaining before reaching the subcutaneous layer.  Toenails x 10 on both feet are thickened, elongated, dystrophic with some debris.  Painful on direct dorsal palpation.   Vascular: Palpable DP pulses, palpable PT pulses. Capillary refill 3 to 5 seconds to the digits.  +1 pitting edema bilaterally.  Telangiectasias and tortuous veins  appreciated to lower extremities bilaterally.  Neurological: Protective sensation absent  Musculoskeletal Exam: High arched foot type appreciated, pes cavus.  Prominent fourth metatarsal head bilaterally.   Assessment/Plan of Care: 1. Cavus deformity of foot, acquired   2. Metatarsalgia of both feet   3. 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 Pain due to onychomycosis of toenails of both feet      Meds ordered this encounter  Medications   mupirocin ointment (BACTROBAN) 2 %    Sig: Apply 1 Application topically 2 (two) times daily for 14 days.    Dispense:  28 g    Refill:  0   None  Discussed clinical findings with patient today.  # Pain due to onychomycosis -Nail plates x 10 were debrided in thickness and length using sterile nail nippers without incident -Mechanical rotary bur used to file down the nails  # Painful preulcerative callus subfourth metatarsal head bilaterally, with recurrent underlying wound with fat layer exposed right foot - The painful calluses were sharply debrided using a #15 blade to patient tolerance.  Underlying ulcerations right forefoot lesion as described above.  Antibiotic ointment and bandage applied.  Offloading felt padding applied.  Instructed patient to change this daily.  Patient is awaiting custom orthotics.  Will see if this can offload the lesions appropriately.  Briefly discussed surgical treatment as well  to alleviate the forefoot pressure describing floating metatarsal osteotomies vs plantar condylectomy's.  We will proceed with conservative care for now.  Return in about 3 weeks to monitor healing.  Lorel Lembo L. Marchia Bond, AACFAS [facility]     [address] [place], [place] [address]                Office [phone]  Fax [phone]

## 2023-10-17 ENCOUNTER — Ambulatory Visit: Payer: 59

## 2023-10-18 NOTE — Progress Notes (Signed)
 Patient was fit with power steps adding MT pads to them he will try and then possibly have custom made with [place] patient is aware of oop, if he goes to [facility] he can get them covered  If he proceeds with [place] I will need to scan him and have [place] ABN signed  Addison Bailey CPed, CFo, CFm

## 2023-10-19 ENCOUNTER — Inpatient Hospital Stay: Payer: 59 | Attending: Oncology

## 2023-10-19 DIAGNOSIS — C61 Malignant neoplasm of prostate: Secondary | ICD-10-CM | POA: Diagnosis present

## 2023-10-19 DIAGNOSIS — Z923 Personal history of irradiation: Secondary | ICD-10-CM | POA: Diagnosis not present

## 2023-10-19 DIAGNOSIS — R3911 Hesitancy of micturition: Secondary | ICD-10-CM | POA: Insufficient documentation

## 2023-10-19 LAB — PSA: Prostatic Specific Antigen: 0.06 ng/mL (ref 0.00–4.00)

## 2023-10-19 NOTE — Progress Notes (Unsigned)
  [facility]  [address] [place],  [place]  [address] [phone]  Clinic Day:  10/20/2023  HISTORY OF PRESENT ILLNESS:  The patient is a 68 y.o. 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  He completed his prostatic radiation, which he received in conjunction with his androgen deprivation therapy.  As he had high risk disease, the patient received 2 years of androgen deprivation therapy, which were completed in 2023.  He comes in today to reassess his PSA level.  Since his last visit, the patient has been doing okay.  He denies having any new symptoms or findings which concern him for disease recurrence.  However, he has had urinary incontinence to where he wears briefs to protect against any accidents.  He has chronic back pain unrelated to his prostate cancer for which he is scheduled to see pain management later this month.  He also has pain in his feet.    PHYSICAL EXAM:  Blood pressure (!) 134/91, pulse 83, temperature 98.1 F (36.7 C), temperature source Oral, resp. rate 18, height 5\' 11"  (1.803 m), weight 215 lb (97.5 kg), SpO2 100%. Wt Readings from Last 3 Encounters:  10/20/23 215 lb (97.5 kg)  04/21/23 208 lb 14.4 oz (94.8 kg)  12/19/22 209 lb 4.8 oz (94.9 kg)   Body mass index is 29.99 kg/m. Performance status (ECOG): 1 GENERAL: The patient is alert and oriented x3, in no acute distress. HEENT EXAM: Clear oropharynx with no exudate or lesions appreciated. LUNG EXAM: Clear to auscultation bilaterally. CARDIAC EXAM: Regular rate and rhythm. No murmurs, rubs, or gallops. ABDOMINAL EXAM: Soft, nontender and nondistended; no hepatosplenomegaly; normal abdominal bowel sounds EXTREMITY EXAM: No clubbing, cyanosis, or edema. LYMPH NODE SURVEY: No palpable cervical, supraclavicular, axillary, or inguinal lymphadenopathy. NEUROLOGIC EXAM: Cranial nerves  II-XII and cerebellar functions are grossly intact.   LABS:    Latest Reference Range & Units 08/11/22 11:03 12/16/22 14:09 04/19/23 13:40 10/19/23 13:36  Prostatic Specific Antigen 0.00 - 4.00 ng/mL <0.01 0.02 0.03 0.06    ASSESSMENT & PLAN:  Assessment/Plan:  A 68 y.o. 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  He underwent combination therapy with androgen deprivation and prostatic radiation.  Furthermore, due to his high risk disease, he received 2 total years of adjuvant deprivation therapy.  Per his recent labs, I am pleased as his PSA still qualifies at being at an undetectable (<0.1) level.  Overall, he appears to be doing okay.  However, as he does have issues with urinary hesitancy/incontinence, I will refer him back to urology to see if this new issue can be rectified.  Otherwise, I will see this patient back in 6 months for repeat clinical assessment.  The patient understands all the plans discussed today and is in agreement with them.    Coda Mathey Kirby Funk, MD

## 2023-10-20 ENCOUNTER — Other Ambulatory Visit: Payer: Self-pay | Admitting: Oncology

## 2023-10-20 ENCOUNTER — Telehealth: Payer: Self-pay | Admitting: Oncology

## 2023-10-20 ENCOUNTER — Inpatient Hospital Stay: Payer: 59 | Admitting: Oncology

## 2023-10-20 VITALS — BP 134/91 | HR 83 | Temp 98.1°F | Resp 18 | Ht 71.0 in | Wt 215.0 lb

## 2023-10-20 DIAGNOSIS — C61 Malignant neoplasm of prostate: Secondary | ICD-10-CM

## 2023-10-20 DIAGNOSIS — R3911 Hesitancy of micturition: Secondary | ICD-10-CM | POA: Diagnosis not present

## 2023-10-20 DIAGNOSIS — Z923 Personal history of irradiation: Secondary | ICD-10-CM | POA: Diagnosis not present

## 2023-10-20 NOTE — Telephone Encounter (Signed)
 Patient has been scheduled for follow-up visit per 10/19/23 LOS.  Pt noted appt details on personal planner/calendar.

## 2023-10-24 ENCOUNTER — Ambulatory Visit (INDEPENDENT_AMBULATORY_CARE_PROVIDER_SITE_OTHER): Admitting: Podiatry

## 2023-10-24 DIAGNOSIS — M216X9 Other acquired deformities of unspecified foot: Secondary | ICD-10-CM | POA: Diagnosis not present

## 2023-10-24 NOTE — Patient Instructions (Signed)
 Insole breke in    Wear these insoles as long as they are comfortable. Try to only wear them indoors until you feel that they are comfortable for outdoor use.  If they bother your feet, TAKE THEM OFF and try them again a few hours later or the next day. Check for any soreness or swelling and notify your doctor immediately if you notice any of these signs. Otherwise, increase wear time as they become more comfortable. If you feel like your shoes are bothersome still after trying these tips, please call our office. Keep in mind, that once the outer sole of the shoe becomes dirty, we can no longer send them back.  The insoles should be changed every 4 months and replaced with a new pair.

## 2023-10-24 NOTE — Progress Notes (Signed)
 Chief Complaint  Patient presents with   Wound Check    Bilateral ulcers. He was wanting to return the powersteps, but he tried to put them in and go, he did not know there was a break in period. I explained to hom the break in process.  Ulcers look a little better. They are still caloused over but looks a little smaller, he does report a little less pain.     HPI: 68 y.o. male presents today treatment of painful preulcerative calluses to both feet subfourth metatarsal head region.  These have ulcerated in the past. He has been doing pretty well from a pain standpoint, they have become painful for him again.  He has been trying to use power step inserts for arch support due to cavus foot type.  I do think he would benefit from a custom insert that can offload the fourth met head lesions.  Past Medical History:  Diagnosis Date   BPH with elevated PSA    Complication of anesthesia    " i HAD A COMPLICATION AT [facility] , BUT I RECALL WHAT IT WAS "   Dyslipidemia    GERD (gastroesophageal reflux disease)    Hypertension    Malignant neoplasm of prostate (HCC)    Malignant neoplasm of prostate (HCC)    Neuropathy    Prostate cancer (HCC)    Tinnitus     Past Surgical History:  Procedure Laterality Date   FACIAL FRACTURE SURGERY     "I run into a crow bar about 4 times"    No Known Allergies  ROS denies nausea, vomiting, fever, chills, chest pain, shortness of breath   Physical Exam: There were no vitals filed for this visit.  General: The patient is alert and oriented x3 in no acute distress.  Dermatology: Painful preulcerative hyperkeratotic lesion 4th metatarsal head regions bilaterally.  No partial thickness breakdown though there is dried heme present and this does get close to the deeper layer of epidermis on both feet. No signs of infection noted. Minimal tissue remaining before reaching the dermal and subcutaneous layer.   Vascular: Palpable DP pulses, palpable PT  pulses. Capillary refill 3 to 5 seconds to the digits.  +1 pitting edema bilaterally.  Telangiectasias and tortuous veins appreciated to lower extremities bilaterally.  Neurological: Protective sensation absent  Musculoskeletal Exam: High arched foot type appreciated, pes cavus.  Prominent fourth metatarsal head bilaterally.   Assessment/Plan of Care: 1. Cavus deformity of foot, acquired      No orders of the defined types were placed in this encounter.  FOR HOME USE ONLY DME CUSTOM ORTHOTICS  Discussed clinical findings with patient today.  # Painful preulcerative callus subfourth metatarsal head bilaterally, - The painful calluses were sharply debrided using a #312 blade to patient tolerance. Debrided as courtesy today. Offloading felt padding applied.   Will see if this can offload the lesions appropriately.  Continue home offloading in the interim.  Patient does require medically necessary regular debridement of the calluses as they do ulcerate without close maintenance  New prescription sent for custom orthotics through [facility] with offloading of the subfourth met head on both feet to limit ulceration risk.  This is due to cavus foot type with plantar flexed sub 4th met heads. We did briefly discuss surgical intervention including floating met head osteotomy if we cannot get these or if they are unhelpful.  Did discuss modification of the power steps and current inserts as well.  Return in about 6 weeks for RFC  Anvitha Hutmacher L. Lunda Salines, AACFAS [facility]     [address] [place], [place] [address]                Office [phone]  Fax [phone]

## 2023-10-26 DIAGNOSIS — E785 Hyperlipidemia, unspecified: Secondary | ICD-10-CM | POA: Diagnosis not present

## 2023-10-26 DIAGNOSIS — K219 Gastro-esophageal reflux disease without esophagitis: Secondary | ICD-10-CM | POA: Diagnosis not present

## 2023-10-26 DIAGNOSIS — G629 Polyneuropathy, unspecified: Secondary | ICD-10-CM | POA: Diagnosis not present

## 2023-10-26 DIAGNOSIS — Z23 Encounter for immunization: Secondary | ICD-10-CM | POA: Diagnosis not present

## 2023-10-26 DIAGNOSIS — R7301 Impaired fasting glucose: Secondary | ICD-10-CM | POA: Diagnosis not present

## 2023-12-05 ENCOUNTER — Ambulatory Visit: Admitting: Podiatry

## 2023-12-19 ENCOUNTER — Ambulatory Visit (INDEPENDENT_AMBULATORY_CARE_PROVIDER_SITE_OTHER)

## 2023-12-19 ENCOUNTER — Ambulatory Visit (INDEPENDENT_AMBULATORY_CARE_PROVIDER_SITE_OTHER): Admitting: Podiatry

## 2023-12-19 DIAGNOSIS — L84 Corns and callosities: Secondary | ICD-10-CM

## 2023-12-19 DIAGNOSIS — M7741 Metatarsalgia, right foot: Secondary | ICD-10-CM | POA: Diagnosis not present

## 2023-12-19 DIAGNOSIS — M7742 Metatarsalgia, left foot: Secondary | ICD-10-CM | POA: Diagnosis not present

## 2023-12-19 DIAGNOSIS — M216X1 Other acquired deformities of right foot: Secondary | ICD-10-CM

## 2023-12-19 DIAGNOSIS — M216X2 Other acquired deformities of left foot: Secondary | ICD-10-CM

## 2023-12-22 ENCOUNTER — Encounter: Payer: Self-pay | Admitting: Podiatry

## 2023-12-22 NOTE — Progress Notes (Signed)
 Chief Complaint  Patient presents with   Wound Check    Bilateral ulcer/callous. He states they are so painful that even pushing the gas hurts.     HPI: 68 y.o. male presents today treatment of painful preulcerative calluses to both feet subfourth metatarsal head region.  These have ulcerated in the past. They do recur quickly and pain makes it difficult for the patient to ambulate.  Past Medical History:  Diagnosis Date   BPH with elevated PSA    Complication of anesthesia     i HAD A COMPLICATION AT [facility] , BUT I RECALL WHAT IT WAS    Dyslipidemia    GERD (gastroesophageal reflux disease)    Hypertension    Malignant neoplasm of prostate (HCC)    Malignant neoplasm of prostate (HCC)    Neuropathy    Prostate cancer (HCC)    Tinnitus     Past Surgical History:  Procedure Laterality Date   FACIAL FRACTURE SURGERY     I run into a crow bar about 4 times    No Known Allergies  ROS denies nausea, vomiting, fever, chills, chest pain, shortness of breath   Physical Exam: There were no vitals filed for this visit.  General: The patient is alert and oriented x3 in no acute distress.  Dermatology: Painful preulcerative hyperkeratotic lesion 4th metatarsal head regions bilaterally.  No partial thickness breakdown though there is dried heme present and this does get close to the deeper layer of dermis on both feet. No signs of infection noted. Minimal tissue remaining before reaching the dermal and subcutaneous layer.   Vascular: Palpable DP pulses, palpable PT pulses. Capillary refill 3 seconds to the digits.  +1 pitting edema bilaterally.  Telangiectasias and tortuous veins appreciated to lower extremities bilaterally.  Neurological: Protective sensation absent  Musculoskeletal Exam: High arched foot type appreciated.  Prominent fourth metatarsal head bilaterally.  Fat pad atrophy.  Generalized pain on palpation of the metatarsal heads plantarly.  Radiographs:  Left and right foot 3 views weightbearing 12/19/2023 No acute fractures, joint spaces preserved.  Normal osseous mineralization.  Prominent plantarflexed metatarsals of the lateral column.  Hammertoe deformities noted.   Assessment/Plan of Care: 1. Metatarsalgia of both feet   2. Pre-ulcerative calluses   3. Plantar flexed metatarsal, left   4. Plantar flexed metatarsal, right      No orders of the defined types were placed in this encounter.  None  Discussed clinical findings with patient today.  # Painful preulcerative callus subfourth metatarsal head bilaterally, - The painful calluses were sharply debrided using a #312 blade to patient tolerance. Debrided as courtesy today. Offloading felt padding applied.   Reviewed consistent offloading with horseshoe pads.   Patient does require medically necessary regular debridement of the calluses as they do ulcerate without close maintenance and due to fat pad atrophy.  # Plantarflexed metatarsals bilaterally - Reviewed radiographs with patient - Did briefly discuss surgical intervention with patient describing floating metatarsal head osteotomy of the corresponding metatarsals with the areas of preulcerative callus. - Patient potentially interested in proceeding with this, we will see if we can get any long-lasting relief with the offloading felt pads in the interim  Follow-up in 4 weeks, possible surgical consultation  Teniola Tseng L. Lunda Salines, AACFAS [facility]     [address]  [place], [place] [address]                Office [phone]  Fax [phone]

## 2024-01-16 ENCOUNTER — Encounter: Payer: Self-pay | Admitting: Podiatry

## 2024-01-16 ENCOUNTER — Ambulatory Visit (INDEPENDENT_AMBULATORY_CARE_PROVIDER_SITE_OTHER): Admitting: Podiatry

## 2024-01-16 DIAGNOSIS — M216X2 Other acquired deformities of left foot: Secondary | ICD-10-CM | POA: Diagnosis not present

## 2024-01-16 DIAGNOSIS — L84 Corns and callosities: Secondary | ICD-10-CM | POA: Diagnosis not present

## 2024-01-16 DIAGNOSIS — M216X1 Other acquired deformities of right foot: Secondary | ICD-10-CM

## 2024-01-16 DIAGNOSIS — M7742 Metatarsalgia, left foot: Secondary | ICD-10-CM

## 2024-01-16 DIAGNOSIS — M7741 Metatarsalgia, right foot: Secondary | ICD-10-CM

## 2024-01-16 NOTE — Progress Notes (Unsigned)
 Chief Complaint  Patient presents with   SX consult    SX consult, today. He wants to know if he will have to go to a rehab.     HPI: 68 y.o. male presents today for surgical consultation.  He has had ongoing pain and areas of severe painful callus related to bilateral subthird metatarsal heads.  At times these have ulcerated.  This area is painful for him today.  Past Medical History:  Diagnosis Date   BPH with elevated PSA    Complication of anesthesia     i HAD A COMPLICATION AT [facility] , BUT I RECALL WHAT IT WAS    Dyslipidemia    GERD (gastroesophageal reflux disease)    Hypertension    Malignant neoplasm of prostate (HCC)    Malignant neoplasm of prostate (HCC)    Neuropathy    Prostate cancer (HCC)    Tinnitus     Past Surgical History:  Procedure Laterality Date   FACIAL FRACTURE SURGERY     I run into a crow bar about 4 times    No Known Allergies  ROS denies nausea, vomiting, fever, chills, chest pain, shortness of breath   Physical Exam: There were no vitals filed for this visit.  General: The patient is alert and oriented x3 in no acute distress.  Dermatology: Significant preulcerative callus present subthird metatarsal head bilaterally.  Vascular: Palpable pedal pulses bilaterally. Capillary refill within normal limits.  Mild lower extremity edema present bilaterally.  Neurological: Light touch sensation grossly intact bilateral feet.   Musculoskeletal Exam: Prominent, plantarflexed third metatarsals bilaterally.  Pain on palpation of the plantar third metatarsal heads with associated preulcerative callus. Fat pad atrophy present.  Radiographic Exam: Left and right foot 3 views weightbearing 12/19/2023 No acute fractures, joint spaces preserved.  Normal osseous mineralization.  Prominent plantarflexed metatarsals of the lateral column. 3rd metatarsals.  Hammertoe deformities noted.  Assessment/Plan of Care: 1. Plantar flexed metatarsal, left    2. Plantar flexed metatarsal, right   3. Pre-ulcerative calluses   4. Metatarsalgia of both feet      No orders of the defined types were placed in this encounter.  None  Discussed clinical findings with patient today.  He has been unable to get significant lasting relief to the areas of painful preulcerative callus present subthird metatarsal head bilaterally.  We have tried palliative debridement, offloading.  We have discussed custom inserts and shoes for the patient however this has been cost prohibitive.  At this point recommend surgical intervention describing floating metatarsal osteotomy of the bilateral third metatarsal head.  Did discuss postoperative course in detail.  Would allow patient to partial weight-bear in surgical shoes following surgery as tolerated.  Informed surgical risk consent was reviewed and read aloud to the patient.  I reviewed the films.  I have discussed my findings with the patient in great detail.  I have discussed all risks including but not limited to infection, stiffness, scarring, limp, disability, deformity, damage to blood vessels and nerves, numbness, poor healing, need for braces, arthritis, chronic pain, amputation, death.  All benefits and realistic expectations discussed in great detail.  I have made no promises as to the outcome.  I have provided realistic expectations.  The patient has expressed good understanding and is electing to proceed.  Written consents obtained.  Surgical shoes x 2 were dispensed for the left and the right foot.  Patient is unable to take NSAIDs for pain.  Surgery  scheduling to contact patient, will plan for this at the surgery center.   Clarissia Mckeen L. Lamount MAUL, AACFAS [facility]     [address] [place], [place] [address]                Office [phone]  Fax [phone]

## 2024-03-06 ENCOUNTER — Telehealth: Payer: Self-pay | Admitting: Podiatry

## 2024-03-06 NOTE — Telephone Encounter (Signed)
 DOS- 03/27/2024  3RD METATARSAL OSTEOTOMY BIL- 71691  [employer] EFFECTIVE DATE- 07/12/2023  DEDUCTIBLE- $257 REMAINING- $0 OOP- $9350 REMAINING- $1156.02 COINSURANCE- 20%/$0 COPAY  PER [employer] WEBSITE, NO PRIOR AUTH IS REQUIRED FOR CPT CODE 28308 (2 UNITS). DECISION ID# [id number]

## 2024-03-14 ENCOUNTER — Telehealth: Payer: Self-pay | Admitting: Podiatry

## 2024-03-14 NOTE — Telephone Encounter (Signed)
 Pts significant other called asking if he would be getting an antibiotic after surgery.  He currently has poison oak and is highly allergic and she is asking if he still has it a day or two before will he need to reschedule the surgery. Surgery is scheduled for 9/17

## 2024-03-21 ENCOUNTER — Telehealth: Payer: Self-pay | Admitting: Lab

## 2024-03-21 NOTE — Telephone Encounter (Signed)
 Called pts friend that takes care of him Merlynn and we have rescheduled the surgery to 10/22

## 2024-03-21 NOTE — Telephone Encounter (Signed)
 Patient still has poison oak and wants to reschedule surgery for the end of October if possible please call [phone] with new date.

## 2024-04-01 ENCOUNTER — Encounter: Admitting: Podiatry

## 2024-04-15 ENCOUNTER — Inpatient Hospital Stay: Attending: Oncology

## 2024-04-15 ENCOUNTER — Encounter: Admitting: Podiatry

## 2024-04-15 DIAGNOSIS — C61 Malignant neoplasm of prostate: Secondary | ICD-10-CM | POA: Insufficient documentation

## 2024-04-15 DIAGNOSIS — Z9229 Personal history of other drug therapy: Secondary | ICD-10-CM | POA: Diagnosis not present

## 2024-04-15 DIAGNOSIS — Z923 Personal history of irradiation: Secondary | ICD-10-CM | POA: Diagnosis not present

## 2024-04-15 LAB — PSA: Prostatic Specific Antigen: 0.04 ng/mL (ref 0.00–4.00)

## 2024-04-21 NOTE — Progress Notes (Unsigned)
  [facility]  [address] [place],  [place]  [address] [phone]  Clinic Day:  10/20/2023  HISTORY OF PRESENT ILLNESS:  The patient is a 68 y.o. 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  He completed his prostatic radiation, which he received in conjunction with his androgen deprivation therapy.  As he had high risk disease, the patient received 2 years of androgen deprivation therapy, which were completed in 2023.  He comes in today to reassess his PSA level.  Since his last visit, the patient has been doing okay.  He denies having any new symptoms or findings which concern him for disease recurrence.  However, he has had urinary incontinence to where he wears briefs to protect against any accidents.  He has chronic back pain unrelated to his prostate cancer for which he is scheduled to see pain management later this month.  He also has pain in his feet.    PHYSICAL EXAM:  There were no vitals taken for this visit. Wt Readings from Last 3 Encounters:  10/20/23 215 lb (97.5 kg)  04/21/23 208 lb 14.4 oz (94.8 kg)  12/19/22 209 lb 4.8 oz (94.9 kg)   There is no height or weight on file to calculate BMI. Performance status (ECOG): 1 GENERAL: The patient is alert and oriented x3, in no acute distress. HEENT EXAM: Clear oropharynx with no exudate or lesions appreciated. LUNG EXAM: Clear to auscultation bilaterally. CARDIAC EXAM: Regular rate and rhythm. No murmurs, rubs, or gallops. ABDOMINAL EXAM: Soft, nontender and nondistended; no hepatosplenomegaly; normal abdominal bowel sounds EXTREMITY EXAM: No clubbing, cyanosis, or edema. LYMPH NODE SURVEY: No palpable cervical, supraclavicular, axillary, or inguinal lymphadenopathy. NEUROLOGIC EXAM: Cranial nerves II-XII and cerebellar functions are grossly intact.   LABS:    Latest Reference Range & Units  04/19/23 13:40 10/19/23 13:36 04/15/24 14:24  Prostatic Specific Antigen 0.00 - 4.00 ng/mL 0.03 0.06 0.04    ASSESSMENT & PLAN:  Assessment/Plan:  A 68 y.o. 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  He underwent combination therapy with androgen deprivation and prostatic radiation.  Furthermore, due to his high risk disease, he received 2 total years of adjuvant deprivation therapy.  Per his recent labs, I am pleased as his PSA still qualifies at being at an undetectable (<0.1) level.  Overall, he appears to be doing okay.  However, as he does have issues with urinary hesitancy/incontinence, I will refer him back to urology to see if this new issue can be rectified.  Otherwise, I will see this patient back in 6 months for repeat clinical assessment.  The patient understands all the plans discussed today and is in agreement with them.    Cameka Rae DELENA Kerns, MD

## 2024-04-22 ENCOUNTER — Inpatient Hospital Stay: Admitting: Oncology

## 2024-04-22 ENCOUNTER — Telehealth: Payer: Self-pay | Admitting: Oncology

## 2024-04-22 ENCOUNTER — Other Ambulatory Visit: Payer: Self-pay | Admitting: Oncology

## 2024-04-22 VITALS — BP 124/83 | HR 83 | Temp 98.1°F | Resp 16 | Ht 71.0 in | Wt 210.5 lb

## 2024-04-22 DIAGNOSIS — C61 Malignant neoplasm of prostate: Secondary | ICD-10-CM

## 2024-04-22 NOTE — Telephone Encounter (Signed)
 Patient has been scheduled for follow-up visit per 04/22/24 LOS.  Pt given an appt calendar with date and time.

## 2024-04-29 ENCOUNTER — Telehealth: Payer: Self-pay | Admitting: Podiatry

## 2024-04-29 NOTE — Telephone Encounter (Signed)
 Received call from Joyce in regards to patients surgery. They are wanting to cancel at this time as patient is having issues with poison oak. She did mention that he has a spot on the bottom of his rt foot. State that it's a callous that has been being treated for some time and the area has opened up. Patient still has post op on books for 10/27. Is it okay to wait until then to see patient for this are or would you like to see him sooner? Thanks

## 2024-04-30 ENCOUNTER — Ambulatory Visit (INDEPENDENT_AMBULATORY_CARE_PROVIDER_SITE_OTHER): Admitting: Podiatry

## 2024-04-30 ENCOUNTER — Encounter: Payer: Self-pay | Admitting: Podiatry

## 2024-04-30 DIAGNOSIS — M216X1 Other acquired deformities of right foot: Secondary | ICD-10-CM | POA: Diagnosis not present

## 2024-04-30 DIAGNOSIS — L97511 Non-pressure chronic ulcer of other part of right foot limited to breakdown of skin: Secondary | ICD-10-CM | POA: Diagnosis not present

## 2024-04-30 DIAGNOSIS — M216X2 Other acquired deformities of left foot: Secondary | ICD-10-CM | POA: Diagnosis not present

## 2024-04-30 DIAGNOSIS — L84 Corns and callosities: Secondary | ICD-10-CM | POA: Diagnosis not present

## 2024-04-30 MED ORDER — MUPIROCIN 2 % EX OINT: 1.0000 | TOPICAL_OINTMENT | Freq: Two times a day (BID) | CUTANEOUS | 2 refills | Status: AC

## 2024-04-30 NOTE — Progress Notes (Unsigned)
 Chief Complaint  Patient presents with   Wound Check    Bilateral wounds. Would like to reschedule SX for Nov or Dec.  Callous/wound closed and the right foot has a black streak through it. He did say he tried to pull some of the dead skin off.     HPI: 68 y.o. male presenting today with bilateral forefoot pain and callus formation secondary to high arch foot type with plantarflexed 3rd and 4th metatarsals bilaterally.  Has had history of ulceration.  There is painful callus on the left foot and he is concerned about reulceration on the right foot.  He was originally scheduled to have surgery tomorrow to help address this via DMMO metatarsal osteotomy, however the patient canceled.  Right foot especially painful.  Past Medical History:  Diagnosis Date   BPH with elevated PSA    Complication of anesthesia     i HAD A COMPLICATION AT [facility] , BUT I RECALL WHAT IT WAS    Dyslipidemia    GERD (gastroesophageal reflux disease)    Hypertension    Malignant neoplasm of prostate (HCC)    Malignant neoplasm of prostate (HCC)    Neuropathy    Prostate cancer (HCC)    Tinnitus    Past Surgical History:  Procedure Laterality Date   FACIAL FRACTURE SURGERY     I run into a crow bar about 4 times   No Known Allergies    PHYSICAL EXAM: There were no vitals filed for this visit.  General: The patient is alert and oriented x3 in no acute distress.  Dermatology: Pedal skin atrophic.  Right foot ulceration site there is overlying hemorrhagic callus with evidence of tissue breakdown as described below.  Preulcerative callus and similar but less severe extent on the left foot.  Both these lesions are distributed between the 3rd and 4th metatarsal heads plantarly..  Interspaces are clear of maceration and debris.      Wound 1:  Location: Right subthird and fourth metatarsal head        Depth: Dermal tissue involvement        Wound Border: Macerated hyperkeratosis following debridement         Wound Base: Fibrous, dermis tissue        Drainage: Scant serous       Odor?:  No        Surrounding Tissue: Macerated        Infected?:  No        Necrosis?:  Loss of overlying skin, near involvement of subcutaneous tissue        Pain?:  Yes        Tunneling: No       Dimensions (cm): Limited by overlying callus formation predebridement, postdebridement measures 1.2 x 0.7 x 0.2 cm, healthy bleeding base following debridement   Vascular: DP and PT pulses palpable 2/4 bilaterally.  Cap refill intact to the digits.  Decreased pedal hair growth.  Mild pitting edema present bilaterally.  Neurological: Light touch sensation intact to the digits.  Musculoskeletal Exam: Muscle strength 5/5 in dorsiflexion, plantarflexion, inversion, eversion.  Cavus foot type.  Fat pad atrophy to the forefoot.  Plantarflexed and prominent 3rd and 4th metatarsal heads bilaterally.  Gastroc equinus noted with less than 10 degrees ankle joint dorsiflexion noted with knee extended bilaterally.      No data to display           ASSESSMENT / PLAN OF CARE: 1. Right foot ulcer, limited to  breakdown of skin (HCC)   2. Plantar flexed metatarsal, left   3. Plantar flexed metatarsal, right   4. Pre-ulcerative calluses      Meds ordered this encounter  Medications   mupirocin  ointment (BACTROBAN ) 2 %    Sig: Apply 1 Application topically 2 (two) times daily.    Dispense:  30 g    Refill:  2   None  Right foot ulceration with dermal tissue involvement subthird and fourth metatarsal head  The ulceration was sharply debrided of hyperkeratotic and devitalized soft tissue with sterile #312 blade and curette to the level of dermal tissue.  Postdebridement measurements 1.2 x 0.7 x 0.2 smears hemostasis obtained.  Antibiotic ointment and DSD applied.  Reviewed off-loading with patient.  Reviewed offloading with patient.  He does have surgical shoes at home, encouraged use of this.  Prescription for mupirocin   sent to patient's pharmacy  Reviewed daily dressing changes with patient.  Discussed risks / concerns regarding ulcer with patient and possible sequelae if left untreated.  Stressed importance of infection prevention at home. Short-term goals are: prevent infection, off-load ulcer, heal ulcer Long-term goals are:  prevent recurrence, prevent amputation.   # Left foot preulcerative callus subthird and fourth metatarsal head -All symptomatic hyperkeratoses were safely debrided with a sterile #312 blade to patient's level of comfort without incident. We discussed preventative and palliative care of these lesions including supportive and accommodative shoegear, padding, prefabricated and custom molded accommodative orthoses, use of a pumice stone and lotions/creams daily. - Medically necessary, patient has history of ulceration  # Plantarflexed 3rd and 4th metatarsals bilaterally - Aggravated by fat pad atrophy, cavus foot type, equinus - Metatarsal pads applied to both feet, demonstrated proper use -Acutely stretching regimen discussed with patient with written instructions dispensed in effort to help decrease forefoot loading. -Previously have discussed extra-depth shoes which would not be covered for patient. - We discussed the planned surgical treatment for this.  Unfortunately the case has been canceled.  He will try and reschedule in the future.  He is concerned about downtime.  Did discuss that while he will need to have decreased activity during recovery, he will be allowed to be weightbearing as tolerated in surgical shoes following the procedure.  Would be planning for floating metatarsal osteotomy of the 3rd and 4th metatarsal heads of each foot.  Had originally planned for bilateral surgery though patient was then opting for starting with 1 foot at a time.  We will discuss this going forward. -I certify that this diagnosis represents a distinct and separate diagnosis that requires  evaluation and treatment separate from other procedures or diagnosis    Return in about 2 weeks (around 05/14/2024) for Wound Care/surgery scheduling.   Terre Hanneman L. Lamount MAUL, AACFAS [facility]     [address] [place], [place] [address]                Office [phone]  Fax [phone]

## 2024-04-30 NOTE — Patient Instructions (Addendum)
 Instructions for Wound Care  The most important step to healing a foot wound is to reduce the pressure on your foot - it is extremely important to stay off your foot as much as possible and wear the shoe/boot as instructed.  Cleanse your foot with saline wash or warm soapy water (dial antibacterial soap or similar).  Blot dry.  Apply prescribed medication to your wound and cover with gauze and a bandage.  May hold bandage in place with Coban (self sticky wrap), Ace bandage or tape.  You may find dressing supplies at your local [employer], [employer], drug store or medical supply store.  Monitor for any signs/symptoms of infection. If there is any increase in redness, red streaks, increase in drainage, warmth to your foot please give us  a call. Also, if you start to run a fever or have flu-like symptoms that can also be a sign of infection. Call the office immediately if any occur or go directly to the emergency room.   If you have any questions, please feel free to give us  a call at [phone] or if you are on MyChart you can always send me a message if needed.   Continue to pad the areas with metatarsal pads or horseshoe pads  Achilles Tendinitis  with Rehab Achilles tendinitis is a disorder of the Achilles tendon. The Achilles tendon connects the large calf muscles (Gastrocnemius and Soleus) to the heel bone (calcaneus). This tendon is sometimes called the heel cord. It is important for pushing-off and standing on your toes and is important for walking, running, or jumping. Tendinitis is often caused by overuse and repetitive microtrauma. SYMPTOMS Pain, tenderness, swelling, warmth, and redness may occur over the Achilles tendon even at rest. Pain with pushing off, or flexing or extending the ankle. Pain that is worsened after or during activity. CAUSES  Overuse sometimes seen with rapid increase in exercise programs or in sports requiring running and jumping. Poor physical conditioning  (strength and flexibility or endurance). Running sports, especially training running down hills. Inadequate warm-up before practice or play or failure to stretch before participation. Injury to the tendon. PREVENTION  Warm up and stretch before practice or competition. Allow time for adequate rest and recovery between practices and competition. Keep up conditioning. Keep up ankle and leg flexibility. Improve or keep muscle strength and endurance. Improve cardiovascular fitness. Use proper technique. Use proper equipment (shoes, skates). To help prevent recurrence, taping, protective strapping, or an adhesive bandage may be recommended for several weeks after healing is complete. PROGNOSIS  Recovery may take weeks to several months to heal. Longer recovery is expected if symptoms have been prolonged. Recovery is usually quicker if the inflammation is due to a direct blow as compared with overuse or sudden strain. RELATED COMPLICATIONS  Healing time will be prolonged if the condition is not correctly treated. The injury must be given plenty of time to heal. Symptoms can reoccur if activity is resumed too soon. Untreated, tendinitis may increase the risk of tendon rupture requiring additional time for recovery and possibly surgery. TREATMENT  The first treatment consists of rest anti-inflammatory medication, and ice to relieve the pain. Stretching and strengthening exercises after resolution of pain will likely help reduce the risk of recurrence. Referral to a physical therapist or athletic trainer for further evaluation and treatment may be helpful. A walking boot or cast may be recommended to rest the Achilles tendon. This can help break the cycle of inflammation and microtrauma. Arch supports (orthotics) may be  prescribed or recommended by your caregiver as an adjunct to therapy and rest. Surgery to remove the inflamed tendon lining or degenerated tendon tissue is rarely necessary and has  shown less than predictable results. MEDICATION  Nonsteroidal anti-inflammatory medications, such as aspirin  and ibuprofen, may be used for pain and inflammation relief. Do not take within 7 days before surgery. Take these as directed by your caregiver. Contact your caregiver immediately if any bleeding, stomach upset, or signs of allergic reaction occur. Other minor pain relievers, such as acetaminophen , may also be used. Pain relievers may be prescribed as necessary by your caregiver. Do not take prescription pain medication for longer than 4 to 7 days. Use only as directed and only as much as you need. Cortisone injections are rarely indicated. Cortisone injections may weaken tendons and predispose to rupture. It is better to give the condition more time to heal than to use them. HEAT AND COLD Cold is used to relieve pain and reduce inflammation for acute and chronic Achilles tendinitis. Cold should be applied for 10 to 15 minutes every 2 to 3 hours for inflammation and pain and immediately after any activity that aggravates your symptoms. Use ice packs or an ice massage. Heat may be used before performing stretching and strengthening activities prescribed by your caregiver. Use a heat pack or a warm soak. SEEK MEDICAL CARE IF: Symptoms get worse or do not improve in 2 weeks despite treatment. New, unexplained symptoms develop. Drugs used in treatment may produce side effects.  EXERCISES:  RANGE OF MOTION (ROM) AND STRETCHING EXERCISES - Achilles Tendinitis  These exercises may help you when beginning to rehabilitate your injury. Your symptoms may resolve with or without further involvement from your physician, physical therapist or athletic trainer. While completing these exercises, remember:  Restoring tissue flexibility helps normal motion to return to the joints. This allows healthier, less painful movement and activity. An effective stretch should be held for at least 30 seconds. A stretch  should never be painful. You should only feel a gentle lengthening or release in the stretched tissue.  STRETCH  Gastroc, Standing  Place hands on wall. Extend right / left leg, keeping the front knee somewhat bent. Slightly point your toes inward on your back foot. Keeping your right / left heel on the floor and your knee straight, shift your weight toward the wall, not allowing your back to arch. You should feel a gentle stretch in the right / left calf. Hold this position for 10 seconds. Repeat 3 times. Complete this stretch 2 times per day.  STRETCH  Soleus, Standing  Place hands on wall. Extend right / left leg, keeping the other knee somewhat bent. Slightly point your toes inward on your back foot. Keep your right / left heel on the floor, bend your back knee, and slightly shift your weight over the back leg so that you feel a gentle stretch deep in your back calf. Hold this position for 10 seconds. Repeat 3 times. Complete this stretch 2 times per day.  STRETCH  Gastrocsoleus, Standing  Note: This exercise can place a lot of stress on your foot and ankle. Please complete this exercise only if specifically instructed by your caregiver.  Place the ball of your right / left foot on a step, keeping your other foot firmly on the same step. Hold on to the wall or a rail for balance. Slowly lift your other foot, allowing your body weight to press your heel down over the edge  of the step. You should feel a stretch in your right / left calf. Hold this position for 10 seconds. Repeat this exercise with a slight bend in your knee. Repeat 3 times. Complete this stretch 2 times per day.   STRENGTHENING EXERCISES - Achilles Tendinitis These exercises may help you when beginning to rehabilitate your injury. They may resolve your symptoms with or without further involvement from your physician, physical therapist or athletic trainer. While completing these exercises, remember:  Muscles can gain  both the endurance and the strength needed for everyday activities through controlled exercises. Complete these exercises as instructed by your physician, physical therapist or athletic trainer. Progress the resistance and repetitions only as guided. You may experience muscle soreness or fatigue, but the pain or discomfort you are trying to eliminate should never worsen during these exercises. If this pain does worsen, stop and make certain you are following the directions exactly. If the pain is still present after adjustments, discontinue the exercise until you can discuss the trouble with your clinician.  STRENGTH - Plantar-flexors  Sit with your right / left leg extended. Holding onto both ends of a rubber exercise band/tubing, loop it around the ball of your foot. Keep a slight tension in the band. Slowly push your toes away from you, pointing them downward. Hold this position for 10 seconds. Return slowly, controlling the tension in the band/tubing. Repeat 3 times. Complete this exercise 2 times per day.   STRENGTH - Plantar-flexors  Stand with your feet shoulder width apart. Steady yourself with a wall or table using as little support as needed. Keeping your weight evenly spread over the width of your feet, rise up on your toes.* Hold this position for 10 seconds. Repeat 3 times. Complete this exercise 2 times per day.  *If this is too easy, shift your weight toward your right / left leg until you feel challenged. Ultimately, you may be asked to do this exercise with your right / left foot only.  STRENGTH  Plantar-flexors, Eccentric  Note: This exercise can place a lot of stress on your foot and ankle. Please complete this exercise only if specifically instructed by your caregiver.  Place the balls of your feet on a step. With your hands, use only enough support from a wall or rail to keep your balance. Keep your knees straight and rise up on your toes. Slowly shift your weight entirely  to your right / left toes and pick up your opposite foot. Gently and with controlled movement, lower your weight through your right / left foot so that your heel drops below the level of the step. You will feel a slight stretch in the back of your calf at the end position. Use the healthy leg to help rise up onto the balls of both feet, then lower weight only on the right / left leg again. Build up to 15 repetitions. Then progress to 3 consecutive sets of 15 repetitions.* After completing the above exercise, complete the same exercise with a slight knee bend (about 30 degrees). Again, build up to 15 repetitions. Then progress to 3 consecutive sets of 15 repetitions.* Perform this exercise 2 times per day.  *When you easily complete 3 sets of 15, your physician, physical therapist or athletic trainer may advise you to add resistance by wearing a backpack filled with additional weight.  STRENGTH - Plantar Flexors, Seated  Sit on a chair that allows your feet to rest flat on the ground. If necessary, sit  at the edge of the chair. Keeping your toes firmly on the ground, lift your right / left heel as far as you can without increasing any discomfort in your ankle. Repeat 3 times. Complete this exercise 2 times a day.

## 2024-05-06 ENCOUNTER — Encounter: Admitting: Podiatry

## 2024-05-14 ENCOUNTER — Ambulatory Visit (INDEPENDENT_AMBULATORY_CARE_PROVIDER_SITE_OTHER): Admitting: Podiatry

## 2024-05-14 DIAGNOSIS — M216X2 Other acquired deformities of left foot: Secondary | ICD-10-CM | POA: Diagnosis not present

## 2024-05-14 DIAGNOSIS — L84 Corns and callosities: Secondary | ICD-10-CM | POA: Diagnosis not present

## 2024-05-14 DIAGNOSIS — M216X1 Other acquired deformities of right foot: Secondary | ICD-10-CM | POA: Diagnosis not present

## 2024-05-14 NOTE — Progress Notes (Signed)
  Subjective:  Patient ID: Adrian Herrera, male    DOB: Dec 09, 1955,  MRN: [id number]  Chief Complaint  Patient presents with   Wound Check    Bilateral wound check, both are calloused over and closed. Schedule SX.      Discussed the use of AI scribe software for clinical note transcription with the patient, who gave verbal consent to proceed.  History of Present Illness Adrian Herrera is a 68 year old male who presents with painful sores and calluses on the balls of his feet.  He experiences ongoing pain and sores on the balls of his feet, with the right foot being more problematic. Achilles stretching exercises have been recommended to reduce pressure on the affected areas. His mobility is impacted, leading him to walk with pressure on his heels to alleviate discomfort. He is concerned about maintaining cleanliness and dryness of bandages post-surgery.      Objective:    Physical Exam MUSCULOSKELETAL: Callus present on the foot, reduced compared to previous examination. Areas previously affected are now healed.   No images are attached to the encounter.    Results RADIOLOGY Foot X-ray: Healed fracture with no prominent callus formation   Assessment:   1. Plantar flexed metatarsal, right   2. Plantar flexed metatarsal, left   3. Pre-ulcerative calluses      Plan:  Patient was evaluated and treated and all questions answered.  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 Previous skin breakdown healed with residual callus. Condition worsened by tight Achilles tendons. - Perform floating metatarsal osteotomies on third and fourth metatarsals of right foot. - Encouraged Achilles stretching exercises. - Advised use of surgical shoe post-operatively for weight-bearing. -  Recommended light walking post-surgery to aid bone displacement. - Discussed surgical risks: pain, delayed healing, infection, rare amputation. - Provided metatarsal pads to offload pressure. - Advised use of safety stool in shower to keep bandages dry. - Coordinate surgery scheduling for mid-December. - Consult cardiologist for cardiac risks prior to surgery.  History of right foot ulcer, now healed Previous right foot ulcer healed, no current ulceration, residual callus not significant. - Continue using callus pads to offload pressure.      No follow-ups on file.

## 2024-05-17 ENCOUNTER — Encounter: Payer: Self-pay | Admitting: Podiatry

## 2024-05-20 ENCOUNTER — Encounter: Admitting: Podiatry

## 2024-06-21 ENCOUNTER — Telehealth: Payer: Self-pay | Admitting: Podiatry

## 2024-06-21 NOTE — Telephone Encounter (Signed)
 DOS- 07/10/2024  2ND+3RD METATARSAL OSTEOTOMY RT- 28308  [facility] EFFECTIVE DATE- 06/10/2024  DEDUCTIBLE- $257 REMAINING- $0 OOP- $9350 REMAINING- $8715.87 COINSURANCE- 20%  PER [employer] PORTAL, PRIOR AUTH IS NOT REQUIRED FOR CPT CODE 71691 (2 UNITS). DECISION ID# [id number]

## 2024-07-09 ENCOUNTER — Telehealth: Payer: Self-pay | Admitting: Podiatry

## 2024-07-09 NOTE — Telephone Encounter (Signed)
 DOS- 07/17/2024  2ND + 3RD METATARSAL OSTEOTOMY RT- 28308  [employer] EFFECTIVE DATE- 07/11/2024  PER [employer] PORTAL, PRIOR AUTH IS NOT REQUIRED FOR CPT CODE 28308 (2 UNITS). DECISION ID# [id number]

## 2024-07-16 ENCOUNTER — Encounter: Admitting: Podiatry

## 2024-07-17 ENCOUNTER — Other Ambulatory Visit: Payer: Self-pay | Admitting: Podiatry

## 2024-07-17 DIAGNOSIS — M216X1 Other acquired deformities of right foot: Secondary | ICD-10-CM

## 2024-07-17 MED ORDER — CEPHALEXIN 500 MG PO CAPS: 500.0000 mg | ORAL_CAPSULE | Freq: Three times a day (TID) | ORAL | 0 refills | Status: AC

## 2024-07-17 MED ORDER — OXYCODONE HCL 5 MG PO TABS: 5.0000 mg | ORAL_TABLET | Freq: Four times a day (QID) | ORAL | 0 refills | Status: AC | PRN

## 2024-07-17 MED ORDER — ACETAMINOPHEN 500 MG PO TABS: 1000.0000 mg | ORAL_TABLET | Freq: Three times a day (TID) | ORAL | 0 refills | Status: AC | PRN

## 2024-07-17 NOTE — Progress Notes (Signed)
Post op medications sent in.

## 2024-07-18 ENCOUNTER — Ambulatory Visit: Admitting: Podiatry

## 2024-07-18 ENCOUNTER — Telehealth: Payer: Self-pay | Admitting: Podiatry

## 2024-07-18 DIAGNOSIS — Z9889 Other specified postprocedural states: Secondary | ICD-10-CM

## 2024-07-18 NOTE — Progress Notes (Unsigned)
 Dr. Helene postop.  Dressing was saturated in blood.  Incision looks good and there is no active bleeding noted on the dorsal right foot.  Steri-Strips were applied for reinforcement.  Betadine Adaptic, Xeroform, and dry sterile dressing was applied followed by Ace wrap and replaced his surgical shoe since it was bloody.  Patient to continue to ice and elevate the foot.  States he has not been wearing the shoe when sleeping

## 2024-07-18 NOTE — Telephone Encounter (Signed)
 Patient called in regards to surgery bandage. He states there is a lot of blood on the bandage and he is unsure whether still actively bleeding or not. Patient unable to make it to [place] to be seen so [employer] office and they are able to see him this afternoon.

## 2024-07-22 ENCOUNTER — Encounter: Admitting: Podiatry

## 2024-07-23 ENCOUNTER — Ambulatory Visit (INDEPENDENT_AMBULATORY_CARE_PROVIDER_SITE_OTHER): Admitting: Podiatry

## 2024-07-23 ENCOUNTER — Ambulatory Visit (INDEPENDENT_AMBULATORY_CARE_PROVIDER_SITE_OTHER)

## 2024-07-23 DIAGNOSIS — Z9889 Other specified postprocedural states: Secondary | ICD-10-CM

## 2024-07-23 DIAGNOSIS — M216X1 Other acquired deformities of right foot: Secondary | ICD-10-CM

## 2024-07-23 NOTE — Progress Notes (Signed)
 "  Subjective:  Patient ID: Adrian Herrera, male    DOB: 03/31/1956,  MRN: [id number]  Chief Complaint  Patient presents with   Routine [name] 07/17/2024 Looks pretty good, sutures intact. Pain is 6/10 right now.      DOS: 07/17/2024 Procedure: Right foot 3rd and 4th metatarsal osteotomy  69 y.o. male returns for post-op check.  Reports doing well.  Pain well-controlled, he has not taken much pain medication.  Ambulating in surgical shoe.  Review of Systems: Negative except as noted in the HPI. Denies N/V/F/Ch.  Past Medical History:  Diagnosis Date   BPH with elevated PSA    Complication of anesthesia     i HAD A COMPLICATION AT [facility] , BUT I RECALL WHAT IT WAS    Dyslipidemia    GERD (gastroesophageal reflux disease)    Hypertension    Malignant neoplasm of prostate (HCC)    Malignant neoplasm of prostate (HCC)    Neuropathy    Prostate cancer (HCC)    Tinnitus    Current Medications[1]  Tobacco Use History[2]  Allergies[3] Objective:  There were no vitals filed for this visit. There is no height or weight on file to calculate BMI. Constitutional Well developed. Well nourished.  Vascular Foot warm and well perfused. Capillary refill normal to all digits.   Neurologic Normal speech. Oriented to person, place, and time. Epicritic sensation to light touch grossly present bilaterally.  Dermatologic Sutures intact to right foot surgical site with skin edges well-approximated.  Skin healing well without signs of infection. Skin edges well coapted without signs of infection. Area of previously noted ulceration right subthird fourth metatarsal heads improving with some hemorrhagic callus.  Orthopedic: Tenderness to palpation noted about the surgical site.   Radiographs: Right foot 3 views 07/23/2024 Postsurgical changes seen status post metatarsal osteotomy of the fourth metatarsal.  Fourth metatarsal in good alignment.  Some elevation of third metatarsal noted  dorsally towards proximal aspect of the third metatarsal fragment  Assessment:   1. Post-operative state   2. Plantar flexed metatarsal, right    Plan:  Patient was evaluated and treated and all questions answered.  S/p foot surgery right 3rd and 4th metatarsal osteotomy -Progressing as expected post-operatively. -XR: Reviewed with patient -WB Status: Weightbearing as tolerated in surgical shoe -Sutures: Intact. -Medications: Not requiring further medication at this time -Foot redressed. - Reviewed findings of alignment of third metatarsal.  Will monitor and see how he progresses as he continues to weight-bear. - Continue to apply mupirocin  to area of plantar ulceration.  Return in about 2 weeks (around 08/06/2024) for Post Op Suture Removal.        [1]  Current Outpatient Medications:    acetaminophen  (TYLENOL ) 500 MG tablet, Take 2 tablets (1,000 mg total) by mouth every 8 (eight) hours as needed for up to 14 days (pain)., Disp: 84 tablet, Rfl: 0   aspirin  EC 81 MG tablet, Take 81 mg by mouth daily., Disp: , Rfl:    atorvastatin  (LIPITOR) 20 MG tablet, Take 20 mg by mouth daily., Disp: , Rfl:    gabapentin (NEURONTIN) 300 MG capsule, Take 1 capsule by mouth every 8 (eight) hours., Disp: , Rfl:    leuprolide  (LUPRON ) 7.5 MG injection, Inject 7.5 mg into the muscle every 28 (twenty-eight) days., Disp: , Rfl:    mupirocin  ointment (BACTROBAN ) 2 %, Apply 1 Application topically 2 (two) times daily., Disp: 30 g, Rfl: 2   omeprazole  (PRILOSEC)  20 MG capsule, Take 1 capsule (20 mg total) by mouth daily., Disp: 90 capsule, Rfl: 4   oxyCODONE  (ROXICODONE ) 5 MG immediate release tablet, Take 1 tablet (5 mg total) by mouth every 6 (six) hours as needed for up to 30 doses for severe pain (pain score 7-10)., Disp: 30 tablet, Rfl: 0   oxyCODONE -acetaminophen  (PERCOCET/ROXICET) 5-325 MG tablet, Take by mouth every 4 (four) hours as needed for severe pain., Disp: , Rfl:    sertraline (ZOLOFT)  100 MG tablet, Take 100 mg by mouth daily., Disp: , Rfl:    sertraline (ZOLOFT) 50 MG tablet, Take 50 mg by mouth daily., Disp: , Rfl:    tadalafil (CIALIS) 5 MG tablet, Take 5 mg by mouth daily as needed for erectile dysfunction., Disp: , Rfl:    traMADol  (ULTRAM ) 50 MG tablet, 1-2 tabs po q6h prn pain, Disp: 25 tablet, Rfl: 0 [2]  Social History Tobacco Use  Smoking Status Former   Current packs/day: 2.00   Average packs/day: 2.0 packs/day for 15.0 years (30.0 ttl pk-yrs)   Types: Cigarettes  Smokeless Tobacco Never  [3] No Known Allergies  "

## 2024-07-29 ENCOUNTER — Encounter: Payer: Self-pay | Admitting: Podiatry

## 2024-07-30 ENCOUNTER — Encounter: Admitting: Podiatry

## 2024-08-06 ENCOUNTER — Encounter: Admitting: Podiatry

## 2024-08-27 ENCOUNTER — Encounter: Admitting: Podiatry

## 2024-10-15 ENCOUNTER — Inpatient Hospital Stay

## 2024-10-21 ENCOUNTER — Inpatient Hospital Stay: Admitting: Oncology
